# Patient Record
Sex: Male | Born: 1980 | Race: Black or African American | Hispanic: No | Marital: Married | Smoking: Current every day smoker
Health system: Southern US, Community
[De-identification: ages and names within clinical notes are randomized; demographics above are authoritative.]

---

## 2017-08-18 ENCOUNTER — Inpatient Hospital Stay
Admission: EM | Admit: 2017-08-18 | Discharge: 2017-08-20 | DRG: 299 | Disposition: A | Discharge status: Home / Self Care | Payer: BLUE CROSS/BLUE SHIELD | Attending: Internal Medicine | Admitting: Internal Medicine

## 2017-08-18 ENCOUNTER — Emergency Department: Payer: BLUE CROSS/BLUE SHIELD

## 2017-08-18 ENCOUNTER — Other Ambulatory Visit: Payer: Self-pay

## 2017-08-18 ENCOUNTER — Inpatient Hospital Stay: Payer: BLUE CROSS/BLUE SHIELD

## 2017-08-18 ENCOUNTER — Encounter: Payer: Self-pay | Admitting: Emergency Medicine

## 2017-08-18 DIAGNOSIS — I2602 Saddle embolus of pulmonary artery with acute cor pulmonale: Secondary | ICD-10-CM

## 2017-08-18 DIAGNOSIS — I82431 Acute embolism and thrombosis of right popliteal vein: Secondary | ICD-10-CM | POA: Diagnosis present

## 2017-08-18 DIAGNOSIS — F17210 Nicotine dependence, cigarettes, uncomplicated: Secondary | ICD-10-CM | POA: Diagnosis not present

## 2017-08-18 DIAGNOSIS — I82411 Acute embolism and thrombosis of right femoral vein: Secondary | ICD-10-CM | POA: Diagnosis not present

## 2017-08-18 DIAGNOSIS — R0602 Shortness of breath: Secondary | ICD-10-CM | POA: Diagnosis not present

## 2017-08-18 DIAGNOSIS — R748 Abnormal levels of other serum enzymes: Secondary | ICD-10-CM | POA: Diagnosis present

## 2017-08-18 DIAGNOSIS — F17200 Nicotine dependence, unspecified, uncomplicated: Secondary | ICD-10-CM | POA: Diagnosis present

## 2017-08-18 DIAGNOSIS — I2699 Other pulmonary embolism without acute cor pulmonale: Secondary | ICD-10-CM | POA: Diagnosis not present

## 2017-08-18 DIAGNOSIS — R55 Syncope and collapse: Secondary | ICD-10-CM

## 2017-08-18 DIAGNOSIS — R609 Edema, unspecified: Secondary | ICD-10-CM

## 2017-08-18 DIAGNOSIS — E876 Hypokalemia: Secondary | ICD-10-CM | POA: Diagnosis present

## 2017-08-18 LAB — HEPATIC FUNCTION PANEL
ALK PHOS: 54 U/L (ref 38–126)
ALT: 22 U/L (ref 17–63)
AST: 31 U/L (ref 15–41)
Albumin: 4.3 g/dL (ref 3.5–5.0)
BILIRUBIN INDIRECT: 1 mg/dL — AB (ref 0.3–0.9)
Bilirubin, Direct: 0.1 mg/dL (ref 0.1–0.5)
TOTAL PROTEIN: 8.1 g/dL (ref 6.5–8.1)
Total Bilirubin: 1.1 mg/dL (ref 0.3–1.2)

## 2017-08-18 LAB — URINALYSIS, COMPLETE (UACMP) WITH MICROSCOPIC
BACTERIA UA: NONE SEEN
BILIRUBIN URINE: NEGATIVE
GLUCOSE, UA: NEGATIVE mg/dL
HGB URINE DIPSTICK: NEGATIVE
KETONES UR: NEGATIVE mg/dL
LEUKOCYTES UA: NEGATIVE
NITRITE: NEGATIVE
PROTEIN: NEGATIVE mg/dL
Specific Gravity, Urine: 1.017 (ref 1.005–1.030)
pH: 6 (ref 5.0–8.0)

## 2017-08-18 LAB — URINE DRUG SCREEN, QUALITATIVE (ARMC ONLY)
Amphetamines, Ur Screen: NOT DETECTED
BARBITURATES, UR SCREEN: NOT DETECTED
BENZODIAZEPINE, UR SCRN: NOT DETECTED
CANNABINOID 50 NG, UR ~~LOC~~: NOT DETECTED
Cocaine Metabolite,Ur ~~LOC~~: NOT DETECTED
MDMA (ECSTASY) UR SCREEN: NOT DETECTED
Methadone Scn, Ur: NOT DETECTED
Opiate, Ur Screen: NOT DETECTED
PHENCYCLIDINE (PCP) UR S: NOT DETECTED
Tricyclic, Ur Screen: NOT DETECTED

## 2017-08-18 LAB — ETHANOL

## 2017-08-18 LAB — LIPASE, BLOOD: LIPASE: 34 U/L (ref 11–51)

## 2017-08-18 LAB — BASIC METABOLIC PANEL
Anion gap: 10 (ref 5–15)
BUN: 11 mg/dL (ref 6–20)
CO2: 23 mmol/L (ref 22–32)
Calcium: 9.1 mg/dL (ref 8.9–10.3)
Chloride: 104 mmol/L (ref 101–111)
Creatinine, Ser: 1.07 mg/dL (ref 0.61–1.24)
GFR calc Af Amer: >60 mL/min (ref >60)
Glucose, Bld: 173 mg/dL — ABNORMAL HIGH (ref 65–99)
POTASSIUM: 3 mmol/L — AB (ref 3.5–5.1)
SODIUM: 137 mmol/L (ref 135–145)

## 2017-08-18 LAB — CBC
HEMATOCRIT: 42.8 % (ref 40.0–52.0)
HEMOGLOBIN: 13.8 g/dL (ref 13.0–18.0)
MCH: 28.9 pg (ref 26.0–34.0)
MCHC: 32.3 g/dL (ref 32.0–36.0)
MCV: 89.6 fL (ref 80.0–100.0)
Platelets: 161 10*3/uL (ref 150–440)
RBC: 4.78 MIL/uL (ref 4.40–5.90)
RDW: 13.5 % (ref 11.5–14.5)
WBC: 13.1 10*3/uL — AB (ref 3.8–10.6)

## 2017-08-18 LAB — APTT: aPTT: 99 seconds — ABNORMAL HIGH (ref 24–36)

## 2017-08-18 LAB — FIBRIN DERIVATIVES D-DIMER (ARMC ONLY): Fibrin derivatives D-dimer (ARMC): 6491.42 ng/mL (FEU) — ABNORMAL HIGH (ref 0.00–499.00)

## 2017-08-18 LAB — ANTITHROMBIN III: AntiThromb III Func: 111 % (ref 75–120)

## 2017-08-18 LAB — PROTIME-INR
INR: 1.1
PROTHROMBIN TIME: 14.1 s (ref 11.4–15.2)

## 2017-08-18 LAB — HEMOGLOBIN A1C
HEMOGLOBIN A1C: 5.3 % (ref 4.8–5.6)
Mean Plasma Glucose: 105.41 mg/dL

## 2017-08-18 LAB — HEPARIN LEVEL (UNFRACTIONATED)
Heparin Unfractionated: 1.32 IU/mL — ABNORMAL HIGH (ref 0.30–0.70)
Heparin Unfractionated: 0.64 IU/mL (ref 0.30–0.70)

## 2017-08-18 LAB — TROPONIN I
TROPONIN I: 0.03 ng/mL — AB (ref <0.03)
Troponin I: 0.61 ng/mL (ref <0.03)

## 2017-08-18 LAB — MAGNESIUM: MAGNESIUM: 2 mg/dL (ref 1.7–2.4)

## 2017-08-18 MED ORDER — ONDANSETRON HCL 4 MG/2ML IJ SOLN: 4.0000 mg | Freq: Four times a day (QID) | INTRAMUSCULAR | Status: DC | PRN

## 2017-08-18 MED ORDER — ACETAMINOPHEN 650 MG RE SUPP: 650.0000 mg | Freq: Four times a day (QID) | RECTAL | Status: DC | PRN

## 2017-08-18 MED ORDER — HEPARIN (PORCINE) IN NACL 100-0.45 UNIT/ML-% IJ SOLN
1600.0000 [IU]/h | INTRAMUSCULAR | Status: DC
  Administered 2017-08-18: 1600 [IU]/h via INTRAVENOUS
  Filled 2017-08-18: qty 250

## 2017-08-18 MED ORDER — POTASSIUM CHLORIDE CRYS ER 20 MEQ PO TBCR
40.0000 meq | EXTENDED_RELEASE_TABLET | Freq: Once | ORAL | Status: AC
  Administered 2017-08-18: 40 meq via ORAL
  Filled 2017-08-18: qty 2

## 2017-08-18 MED ORDER — HEPARIN (PORCINE) IN NACL 100-0.45 UNIT/ML-% IJ SOLN
1400.0000 [IU]/h | INTRAMUSCULAR | Status: DC
  Administered 2017-08-18: 1600 [IU]/h via INTRAVENOUS
  Administered 2017-08-19: 1400 [IU]/h via INTRAVENOUS
  Filled 2017-08-18 (×2): qty 250

## 2017-08-18 MED ORDER — SODIUM CHLORIDE 0.9 % IV SOLN
INTRAVENOUS | Status: DC
  Administered 2017-08-18 – 2017-08-20 (×4): via INTRAVENOUS

## 2017-08-18 MED ORDER — ONDANSETRON HCL 4 MG PO TABS: 4.0000 mg | ORAL_TABLET | Freq: Four times a day (QID) | ORAL | Status: DC | PRN

## 2017-08-18 MED ORDER — HEPARIN BOLUS VIA INFUSION
5500.0000 [IU] | Freq: Once | INTRAVENOUS | Status: AC
Start: 2017-08-18 — End: 2017-08-18
  Administered 2017-08-18: 5500 [IU] via INTRAVENOUS
  Filled 2017-08-18: qty 5500

## 2017-08-18 MED ORDER — IOPAMIDOL (ISOVUE-370) INJECTION 76%
75.0000 mL | Freq: Once | INTRAVENOUS | Status: AC | PRN
  Administered 2017-08-18: 75 mL via INTRAVENOUS

## 2017-08-18 MED ORDER — SODIUM CHLORIDE 0.9 % IV BOLUS (SEPSIS)
1000.0000 mL | Freq: Once | INTRAVENOUS | Status: AC
  Administered 2017-08-18: 1000 mL via INTRAVENOUS

## 2017-08-18 MED ORDER — NICOTINE 21 MG/24HR TD PT24
21.0000 mg | MEDICATED_PATCH | Freq: Every day | TRANSDERMAL | Status: DC
  Administered 2017-08-18 – 2017-08-20 (×3): 21 mg via TRANSDERMAL
  Filled 2017-08-18 (×3): qty 1

## 2017-08-18 MED ORDER — ACETAMINOPHEN 325 MG PO TABS: 650.0000 mg | ORAL_TABLET | Freq: Four times a day (QID) | ORAL | Status: DC | PRN

## 2017-08-18 NOTE — ED Notes (Signed)
Lab called regarding pending labs. State they were unaware of new orders but would run them immediately. MD and family made aware.

## 2017-08-18 NOTE — ED Notes (Signed)
Pt given urinal, unable to provide urine sample at this time.

## 2017-08-18 NOTE — Discharge Instructions (Addendum)
From Dr. Dolores FrameSung: 1.  Drink plenty of fluids and get plenty of rest daily. 2.  Return to the ER for worsening symptoms, persistent vomiting, difficulty breathing or other concerns.  From Dr. Shaune PollackLord: Although no certain cause was found, your exam and evaluation are overall reassuring in the emergency department today.  Make sure you are getting plenty of rest, at this may have been brought on by extreme exhaustion.

## 2017-08-18 NOTE — Progress Notes (Signed)
Attempt to see patient this afternoon. Patient is off floor. Will stop by tomorrow.

## 2017-08-18 NOTE — Progress Notes (Signed)
ANTICOAGULATION CONSULT NOTE - Initial Consult  Pharmacy Consult for heparin drip Indication: pulmonary embolus  No Known Allergies  Patient Measurements: Height: 5' 9.5" (176.5 cm) Weight: 230 lb 6.4 oz (104.5 kg) IBW/kg (Calculated) : 71.85 Heparin Dosing Weight: 95 kg  Vital Signs: Temp: 98 F (36.7 C) (02/28 1333) Temp Source: Oral (02/28 1333) BP: 129/79 (02/28 1333) Pulse Rate: 68 (02/28 1333)  Labs: Recent Labs    08/18/17 0524 08/18/17 1016 08/18/17 1155 08/18/17 2027  HGB 13.8  --   --   --   HCT 42.8  --   --   --   PLT 161  --   --   --   APTT  --   --  99*  --   LABPROT  --   --  14.1  --   INR  --   --  1.10  --   HEPARINUNFRC  --   --  1.32* 0.64  CREATININE 1.07  --   --   --   TROPONINI 0.03* 0.61*  --   --     Estimated Creatinine Clearance: 111.3 mL/min (by C-G formula based on SCr of 1.07 mg/dL).   Medical History: History reviewed. No pertinent past medical history.  Assessment: Pharmacy consulted to dose and monitor heparin drip in this 37 year old male diagnosed with PE. Patient was not on anticoagulation prior to admission per med rec. Baseline labs ordered.  Goal of Therapy:  Heparin level 0.3-0.7 units/ml Monitor platelets by anticoagulation protocol: Yes   Plan:  Give 5500 units bolus x 1 Start heparin infusion at 1600 units/hr Check anti-Xa level in 6 hours and daily while on heparin Continue to monitor H&H and platelets   2/28@2100  HL 0.64, continue current rate of heparin 1600units/hr and recheck in 6 hours per protocol.   Luan PullingGarrett Kylee Umana, PharmD, BCPS Clinical Pharmacist 08/18/2017,9:08 PM

## 2017-08-18 NOTE — H&P (Signed)
[employer] [facility] [place] [facility]   PATIENT NAME: Jay Barnes    MR#:  [id number]  DATE OF BIRTH:  [birth year]  DATE OF ADMISSION:  08/18/2017  PRIMARY CARE PHYSICIAN: System, Pcp Not In   REQUESTING/REFERRING PHYSICIAN: Governor Rooks MD  CHIEF COMPLAINT:   Chief Complaint  Patient presents with  . Near Syncope  . Emesis    HISTORY OF PRESENT ILLNESS: Jay Barnes  is a 37 y.o. male with no medical problems presented to the emergency with complaint of near syncope and nausea and vomiting.  Patient in the ER was noted to have elevated troponin therefore d-dimer was checked which was elevated therefore had a CT of the chest per pulmonary embolism protocol was done which showed bilateral saddle emboli. The ER physician discussed with intensivist at [facility] who stated that patient did not need TPA or any other intervention because he was not hypoxic and his blood pressure is currently stable.  Patient denies any chest pain or shortness of breath he just felt dizzy.  PAST MEDICAL HISTORY:  History reviewed. No pertinent past medical history.  PAST SURGICAL HISTORY: History reviewed. No pertinent surgical history.  SOCIAL HISTORY:  Social History   Tobacco Use  . Smoking status: Current Every Day Smoker  . Smokeless tobacco: Never Used  Substance Use Topics  . Alcohol use: Yes    Comment: occasional     FAMILY HISTORY:  Family History  Problem Relation Age of Onset  . CAD Maternal Grandmother     DRUG ALLERGIES: No Known Allergies  REVIEW OF SYSTEMS:   CONSTITUTIONAL: No fever, positive fatigue or positive weakness.  EYES: No blurred or double vision.  EARS, NOSE, AND THROAT: No tinnitus or ear pain.  RESPIRATORY: No cough, shortness of breath, wheezing or hemoptysis.  CARDIOVASCULAR: No chest pain, orthopnea, edema.  GASTROINTESTINAL: No nausea, vomiting, diarrhea or abdominal pain.  GENITOURINARY: No dysuria, hematuria.  ENDOCRINE: No  polyuria, nocturia,  HEMATOLOGY: No anemia, easy bruising or bleeding SKIN: No rash or lesion. MUSCULOSKELETAL: No joint pain or arthritis.   NEUROLOGIC: No tingling, numbness, weakness.  PSYCHIATRY: No anxiety or depression.   MEDICATIONS AT HOME:  Prior to Admission medications   Not on File      PHYSICAL EXAMINATION:   VITAL SIGNS: Blood pressure 133/81, pulse 78, temperature 97.6 F (36.4 C), temperature source Oral, resp. rate 20, height 5' 9.5" (1.765 m), weight 237 lb (107.5 kg), SpO2 97 %.  GENERAL:  37 y.o.-year-old patient lying in the bed with no acute distress.  EYES: Pupils equal, round, reactive to light and accommodation. No scleral icterus. Extraocular muscles intact.  HEENT: Head atraumatic, normocephalic. Oropharynx and nasopharynx clear.  NECK:  Supple, no jugular venous distention. No thyroid enlargement, no tenderness.  LUNGS: Normal breath sounds bilaterally, no wheezing, rales,rhonchi or crepitation. No use of accessory muscles of respiration.  CARDIOVASCULAR: S1, S2 normal. No murmurs, rubs, or gallops.  ABDOMEN: Soft, nontender, nondistended. Bowel sounds present. No organomegaly or mass.  EXTREMITIES: No pedal edema, cyanosis, or clubbing.  NEUROLOGIC: Cranial nerves II through XII are intact. Muscle strength 5/5 in all extremities. Sensation intact. Gait not checked.  PSYCHIATRIC: The patient is alert and oriented x 3.  SKIN: No obvious rash, lesion, or ulcer.   LABORATORY PANEL:   CBC Recent Labs  Lab 08/18/17 0524  WBC 13.1*  HGB 13.8  HCT 42.8  PLT 161  MCV 89.6  MCH 28.9  MCHC 32.3  RDW 13.5   ------------------------------------------------------------------------------------------------------------------  Chemistries  Recent Labs  Lab 08/18/17 0524  NA 137  K 3.0*  CL 104  CO2 23  GLUCOSE 173*  BUN 11  CREATININE 1.07  CALCIUM 9.1  AST 31  ALT 22  ALKPHOS 54  BILITOT 1.1    ------------------------------------------------------------------------------------------------------------------ estimated creatinine clearance is 112.9 mL/min (by C-G formula based on SCr of 1.07 mg/dL). ------------------------------------------------------------------------------------------------------------------ No results for input(s): TSH, T4TOTAL, T3FREE, THYROIDAB in the last 72 hours.  Invalid input(s): FREET3   Coagulation profile No results for input(s): INR, PROTIME in the last 168 hours. ------------------------------------------------------------------------------------------------------------------- No results for input(s): DDIMER in the last 72 hours. -------------------------------------------------------------------------------------------------------------------  Cardiac Enzymes Recent Labs  Lab 08/18/17 0524 08/18/17 1016  TROPONINI 0.03* 0.61*   ------------------------------------------------------------------------------------------------------------------ Invalid input(s): POCBNP  ---------------------------------------------------------------------------------------------------------------  Urinalysis    Component Value Date/Time   COLORURINE YELLOW (A) 08/18/2017 0633   APPEARANCEUR HAZY (A) 08/18/2017 0633   LABSPEC 1.017 08/18/2017 0633   PHURINE 6.0 08/18/2017 0633   GLUCOSEU NEGATIVE 08/18/2017 0633   HGBUR NEGATIVE 08/18/2017 0633   BILIRUBINUR NEGATIVE 08/18/2017 0633   KETONESUR NEGATIVE 08/18/2017 0633   PROTEINUR NEGATIVE 08/18/2017 0633   NITRITE NEGATIVE 08/18/2017 0633   LEUKOCYTESUR NEGATIVE 08/18/2017 [id number]     RADIOLOGY: Ct Head Wo Contrast  Result Date: 08/18/2017 CLINICAL DATA:  37 year old male with lightheadedness and weakness. EXAM: CT HEAD WITHOUT CONTRAST TECHNIQUE: Contiguous axial images were obtained from the base of the skull through the vertex without intravenous contrast. COMPARISON:  None. FINDINGS: Brain: No  evidence of acute infarction, hemorrhage, hydrocephalus, extra-axial collection or mass lesion/mass effect. Vascular: No hyperdense vessel or unexpected calcification. Skull: Normal. Negative for fracture or focal lesion. Sinuses/Orbits: No acute finding. Other: None. IMPRESSION: Normal noncontrast CT of the brain. Electronically Signed   By: Elgie CollardArash  Radparvar M.D.   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 Shortness of breath EXAM: CT ANGIOGRAPHY CHEST WITH CONTRAST TECHNIQUE: Multidetector CT imaging of the chest was performed using the standard protocol during bolus administration of intravenous contrast. Multiplanar CT image reconstructions and MIPs were obtained to evaluate the vascular anatomy. CONTRAST:  75mL ISOVUE-370 IOPAMIDOL (ISOVUE-370) INJECTION 76% COMPARISON:  None FINDINGS: Cardiovascular: The heart size is normal. There is no pericardial effusion. Mild aortic atherosclerosis. Large saddle embolus and bilateral central obstructing pulmonary emboli identified. Bilateral upper and lower lobar and segmental pulmonary artery filling defects are noted. The RV to LV ratio is equal to 5/3.2 = 1.6. Mediastinum/Nodes: The trachea appears patent and midline. Normal appearance of the thyroid gland. Normal appearance of the esophagus. No enlarged mediastinal or hilar lymph nodes. Lungs/Pleura: No airspace consolidation or atelectasis. No pneumothorax. Upper Abdomen: No acute abnormality. Musculoskeletal: No aggressive lytic or sclerotic bone lesions. Review of the MIP images confirms the above findings. IMPRESSION: 1. Positive for acute PE with CT evidence of right heart strain (RV/LV Ratio = 1.6.) consistent with at least submassive (intermediate risk) PE. The presence of right heart strain has been associated with an increased risk of morbidity and mortality. Please activate  Code PE by paging [phone]. 2. Critical Value/emergent results were called by telephone at the time of interpretation on 08/18/2017 at 11:01 am to Dr. Governor RooksEBECCA LORD , who verbally acknowledged these results. 3.  Aortic Atherosclerosis (ICD10-I70.0). Electronically Signed   By: Signa Kellaylor  Stroud M.D.   On: 08/18/2017 11:02   [facility]  Result Date: 08/18/2017 CLINICAL DATA:  37 year old male with near syncope. EXAM: PORTABLE CHEST 1 VIEW COMPARISON:  None. FINDINGS: The heart size and mediastinal contours are within normal limits. Both lungs are clear. The visualized skeletal structures are unremarkable. IMPRESSION: No active disease. Electronically Signed   By: Elgie Collard M.D.   On: 08/18/2017 06:04    EKG: Orders placed or performed during the hospital encounter of 08/18/17  . EKG 12-Lead  . EKG 12-Lead  . ED EKG  . ED EKG  . ED EKG  . ED EKG  . EKG 12-Lead  . EKG 12-Lead    IMPRESSION AND PLAN: Patient is a 37 year old African-American male presenting with dizziness  1.  Bilateral saddle pulmonary embolism Per intensivist at [facility] does not need TPA We will treat patient with heparin Check bilateral lower extremity Doppler Start hypercoagulable workup Hematology evaluation Keep tele on him  2.  Elevated troponin due to PE  3.  Hypokalemia potassium is being replete  4.  Nicotine abuse smoking cessation provided 4 minutes spent wrongly recommend he stop smoking nicotine patch will be started    All the records are reviewed and case discussed with ED provider. Management plans discussed with the patient, family and they are in agreement.  CODE STATUS: Code Status History    This patient does not have a recorded code status. Please follow your organizational policy for patients in this situation.       TOTAL TIME TAKING CARE OF THIS PATIENT:55 minutes.    Auburn Bilberry M.D on 08/18/2017 at 11:40 AM  Between 7am to 6pm - Pager -  [phone]  After 6pm go to [email or web address] - password [email or web address]  [facility]  Office  [phone]  CC: Primary care physician; System, Pcp Not In

## 2017-08-18 NOTE — Progress Notes (Signed)
ANTICOAGULATION CONSULT NOTE - Initial Consult  Pharmacy Consult for heparin drip Indication: pulmonary embolus  No Known Allergies  Patient Measurements: Height: 5' 9.5" (176.5 cm) Weight: 237 lb (107.5 kg) IBW/kg (Calculated) : 71.85 Heparin Dosing Weight: 95 kg  Vital Signs: Temp: 97.6 F (36.4 C) (02/28 0522) Temp Source: Oral (02/28 0522) BP: 133/81 (02/28 1000) Pulse Rate: 78 (02/28 1000)  Labs: Recent Labs    08/18/17 0524 08/18/17 1016  HGB 13.8  --   HCT 42.8  --   PLT 161  --   CREATININE 1.07  --   TROPONINI 0.03* 0.61*    Estimated Creatinine Clearance: 112.9 mL/min (by C-G formula based on SCr of 1.07 mg/dL).   Medical History: History reviewed. No pertinent past medical history.  Assessment: Pharmacy consulted to dose and monitor heparin drip in this 37 year old male diagnosed with PE. Patient was not on anticoagulation prior to admission per med rec. Baseline labs ordered.  Goal of Therapy:  Heparin level 0.3-0.7 units/ml Monitor platelets by anticoagulation protocol: Yes   Plan:  Give 5500 units bolus x 1 Start heparin infusion at 1600 units/hr Check anti-Xa level in 6 hours and daily while on heparin Continue to monitor H&H and platelets  Cindi CarbonMary M Markeya Mincy, PharmD, BCPS Clinical Pharmacist 08/18/2017,11:15 AM

## 2017-08-18 NOTE — ED Notes (Signed)
Patient and family updated on lab results and repeat testing by this RN and MD. Family made aware of pending procedure. Verbalized understanding. Family and patient given drinks and warm blanket.

## 2017-08-18 NOTE — ED Triage Notes (Addendum)
Pt bib [facility] from work where he felt lightheaded and weak, did not pass out, but urinated on himself and does not recall it. Pt vomited with EMS one time. Pt states he is feeling weak at this time. A&Ox4. Denies medications, drugs, diet changes.

## 2017-08-18 NOTE — Progress Notes (Signed)
Admitted from the ED with bilateral PE on heparin drip.  No other symptoms or complaints.

## 2017-08-18 NOTE — ED Notes (Signed)
Pt provided water and graham crackers with PB

## 2017-08-18 NOTE — ED Provider Notes (Signed)
[facility] Emergency Department Provider Note   ____________________________________________   First MD Initiated Contact with Patient 08/18/17 [phone]     (approximate)  I have reviewed the triage vital signs and the nursing notes.   HISTORY  Chief Complaint Near Syncope and Emesis    HPI Jay Barnes is a 37 y.o. male brought to the ED from work via EMS with a chief complaint of generalized weakness, lightheadedness and near syncope.  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  He did not pass out but eased himself to the ground.  He had to urinate but did not have the strength to get up so he intentionally urinated on the floor EMS reports vomiting once.  Patient reports usually he is able to get a nap in the afternoons but yesterday he was not tired so did not nap.  Has not been to sleep in the past 25 hours.  Denies recent fever, chills, chest pain, shortness of breath, abdominal pain, nausea, diarrhea.  Denies recent trauma.  Denies illicit drug use.     Past medical history None  There are no active problems to display for this patient.   History reviewed. No pertinent surgical history.  Prior to Admission medications   Not on File    Allergies Patient has no known allergies.  No family history on file.  Social History Social History   Tobacco Use  . Smoking status: Current Every Day Smoker  . Smokeless tobacco: Never Used  Substance Use Topics  . Alcohol use: Yes    Comment: occasional   . Drug use: No    Review of Systems   Constitutional: Positive for generalized weakness.  No fever/chills. Eyes: No visual changes. ENT: No sore throat. Cardiovascular: Denies chest pain. Respiratory: Denies shortness of breath. Gastrointestinal: No abdominal pain.  No  nausea, no vomiting.  No diarrhea.  No constipation. Genitourinary: Negative for dysuria. Musculoskeletal: Negative for back pain. Skin: Negative for rash. Neurological: Positive for near syncope.  Negative for headaches, focal weakness or numbness.   ____________________________________________   PHYSICAL EXAM:  VITAL SIGNS: ED Triage Vitals  Enc Vitals Group     BP 08/18/17 0522 131/79     Pulse Rate 08/18/17 0522 87     Resp 08/18/17 0522 19     Temp 08/18/17 0522 97.6 F (36.4 C)     Temp Source 08/18/17 0522 Oral     SpO2 08/18/17 0522 97 %     Weight 08/18/17 0523 237 lb (107.5 kg)     Height 08/18/17 0523 5' 9.5" (1.765 m)     Head Circumference --      Peak Flow --      Pain Score --      Pain Loc --      Pain Edu? --      Excl. in GC? --     Constitutional: Asleep, awakened for exam.  Alert and oriented. Well appearing and in no acute distress. Eyes: Conjunctivae are normal. PERRL. EOMI. Head: Atraumatic. Nose: No congestion/rhinnorhea. Mouth/Throat: Mucous membranes are moist.  Oropharynx non-erythematous. Neck: No stridor.  No carotid bruits. Cardiovascular: Normal rate, regular rhythm. Grossly normal heart sounds.  Good peripheral circulation. Respiratory: Normal respiratory effort.  No retractions. Lungs CTAB. Gastrointestinal: Soft and nontender. No distention. No abdominal  bruits. No CVA tenderness. Musculoskeletal: No lower extremity tenderness nor edema.  No joint effusions. Neurologic:  Normal speech and language. No gross focal neurologic deficits are appreciated. No gait instability. Skin:  Skin is warm, dry and intact. No rash noted. Psychiatric: Mood and affect are normal. Speech and behavior are normal.  ____________________________________________   LABS (all labs ordered are listed, but only abnormal results are displayed)  Labs Reviewed  BASIC METABOLIC PANEL - Abnormal; Notable for the following components:      Result Value   Potassium  3.0 (*)    Glucose, Bld 173 (*)    All other components within normal limits  CBC - Abnormal; Notable for the following components:   WBC 13.1 (*)    All other components within normal limits  URINALYSIS, COMPLETE (UACMP) WITH MICROSCOPIC - Abnormal; Notable for the following components:   Color, Urine YELLOW (*)    APPearance HAZY (*)    Squamous Epithelial / LPF 0-5 (*)    All other components within normal limits  TROPONIN I  URINE DRUG SCREEN, QUALITATIVE ([facility] ONLY)  HEPATIC FUNCTION PANEL  LIPASE, BLOOD  ETHANOL  FIBRIN DERIVATIVES D-DIMER ([facility] ONLY)  CBG MONITORING, ED   ____________________________________________  EKG  ED ECG REPORT I, Rome Echavarria J, the attending physician, personally viewed and interpreted this ECG.   Date: 08/18/2017  EKG Time: 0521  Rate: 82  Rhythm: normal EKG, normal sinus rhythm  Axis: Normal  Intervals:none  ST&T Change: Nonspecific  ____________________________________________  RADIOLOGY  ED MD interpretation: No acute process  Official radiology report(s): Ct Head Wo Contrast  Result Date: 08/18/2017 CLINICAL DATA:  37 year old male with lightheadedness and weakness. EXAM: CT HEAD WITHOUT CONTRAST TECHNIQUE: Contiguous axial images were obtained from the base of the skull through the vertex without intravenous contrast. COMPARISON:  None. FINDINGS: Brain: No evidence of acute infarction, hemorrhage, hydrocephalus, extra-axial collection or mass lesion/mass effect. Vascular: No hyperdense vessel or unexpected calcification. Skull: Normal. Negative for fracture or focal lesion. Sinuses/Orbits: No acute finding. Other: None. IMPRESSION: Normal noncontrast CT of the brain. Electronically Signed   By: Elgie Collard M.D.   On: 08/18/2017 06:01   Dg Chest Port 1 View  Result Date: 08/18/2017 CLINICAL DATA:  37 year old male with near syncope. EXAM: PORTABLE CHEST 1 VIEW COMPARISON:  None. FINDINGS: The heart size and mediastinal contours  are within normal limits. Both lungs are clear. The visualized skeletal structures are unremarkable. IMPRESSION: No active disease. Electronically Signed   By: Elgie Collard M.D.   On: 08/18/2017 06:04    ____________________________________________   PROCEDURES  Procedure(s) performed: None  Procedures  Critical Care performed: No  ____________________________________________   INITIAL IMPRESSION / ASSESSMENT AND PLAN / ED COURSE  As part of my medical decision making, I reviewed the following data within the electronic MEDICAL RECORD NUMBER Nursing notes reviewed and incorporated, Labs reviewed, EKG interpreted, Old chart reviewed, Radiograph reviewed and Notes from prior ED visits.   37 year old male who presents with generalized weakness, near syncope.  This is in the setting of sleep deprivation.  Differential diagnosis includes but is not limited to fatigue, CAD, TIA, CVA, hypoglycemia, infectious etiologies, etc.  Laboratory results thus far notable for mild leukocytosis, mild hypokalemia.  CT head and chest x-ray unremarkable.  Will obtain orthostatic vital signs, initiate IV fluid resuscitation, replete potassium.  Check UDS, troponin, d-dimer.  Patient hungry; will provide sandwich tray.  Clinical Course as of Aug 18 710  Thu Aug 18, 2017  [id number] Pending labwork and UDS. Care transferred to Dr. Shaune Pollack. Anticipate discharge home if labwork unremarkable.   [JS]    Clinical Course User Index [JS] Irean Hong, MD     ____________________________________________   FINAL CLINICAL IMPRESSION(S) / ED DIAGNOSES  Final diagnoses:  Near syncope  Hypokalemia     ED Discharge Orders    None       Note:  This document was prepared using Dragon voice recognition software and may include unintentional dictation errors.    Irean Hong, MD 08/18/17 [phone]

## 2017-08-18 NOTE — ED Provider Notes (Signed)
[facility]  I accepted care from Dr. Dolores FrameSung ____________________________________________    LABS (pertinent positives/negatives)  I, Jay Rooksebecca Karanvir Balderston, MD have personally reviewed the lab reports noted below.  Labs Reviewed  BASIC METABOLIC PANEL - Abnormal; Notable for the following components:      Result Value   Potassium 3.0 (*)    Glucose, Bld 173 (*)    All other components within normal limits  CBC - Abnormal; Notable for the following components:   WBC 13.1 (*)    All other components within normal limits  URINALYSIS, COMPLETE (UACMP) WITH MICROSCOPIC - Abnormal; Notable for the following components:   Color, Urine YELLOW (*)    APPearance HAZY (*)    Squamous Epithelial / LPF 0-5 (*)    All other components within normal limits  HEPATIC FUNCTION PANEL - Abnormal; Notable for the following components:   Indirect Bilirubin 1.0 (*)    All other components within normal limits  FIBRIN DERIVATIVES D-DIMER ([facility] ONLY) - Abnormal; Notable for the following components:   Fibrin derivatives D-dimer [facility]) [phone] (*)    All other components within normal limits  TROPONIN I - Abnormal; Notable for the following components:   Troponin I 0.03 (*)    All other components within normal limits  TROPONIN I - Abnormal; Notable for the following components:   Troponin I 0.61 (*)    All other components within normal limits  URINE DRUG SCREEN, QUALITATIVE ([facility] ONLY)  LIPASE, BLOOD  ETHANOL  APTT  PROTIME-INR  CBG MONITORING, ED     ____________________________________________    RADIOLOGY All xrays were viewed by me. Imaging interpreted by radiologist.  I, Jay Rooksebecca Dyllan Kats MD have personally reviewed the imaging report noted below.  CT chest for PE radiologist (I spoke by phone with radiologist): Bilateral/saddle PE with right heart strain.  ____________________________________________   PROCEDURES  Procedure(s) performed: None  Critical Care  performed: None  ____________________________________________   INITIAL IMPRESSION / ASSESSMENT AND PLAN / ED COURSE   Pertinent labs & imaging results that were available during my care of the patient were reviewed by me and considered in my medical decision making (see chart for details).  I accepted care from Dr. Dolores FrameSung at shift change.  Suspicion with initial laboratory studies back CBC and CMP reassuring, orthostatics reassuring, as the patient had exhaustion after being awake for 25+ hours.  Awaiting several additional studies.  Review of troponin 0 0.03, doubt ACS, will send repeat troponin now, as it has been about 5 hours already.  No ongoing chest pain.  D-dimer significantly elevated, discussed with patient, chose to proceed with CT to rule out PE.  Discussed with radiologist, significant bilateral/saddle PE with right heart strain.  Discussed and updated the patient, started on heparin.  Radiologist did activate code PE.  I discussed with intensivist at [facility].   CONSULTATIONS: Discussed with radiologist by phone the PET scan result.  I discussed with intensivist at [facility], no indication for thrombectomy or transfer given no hypoxia or hypotension.  Patient will be placed on heparin and admitted to hospitalist here.  [facility]iscuss[facility] for [facility].    Patient / Family / Caregiver informed of clinical course, medical decision-making process, and agree with plan.   ____________________________________________   FINAL CLINICAL IMPRESSION(S) / ED DIAGNOSES  Final diagnoses:  Near syncope  Hypokalemia  Acute saddle pulmonary embolism with acute cor pulmonale (HCC)        Jay Barnes, Jay Sherrow, MD 08/18/17 1126

## 2017-08-19 ENCOUNTER — Inpatient Hospital Stay (HOSPITAL_COMMUNITY)
Admit: 2017-08-19 | Discharge: 2017-08-19 | Disposition: A | Discharge status: Home / Self Care | Payer: BLUE CROSS/BLUE SHIELD | Attending: Internal Medicine | Admitting: Internal Medicine

## 2017-08-19 DIAGNOSIS — I2699 Other pulmonary embolism without acute cor pulmonale: Secondary | ICD-10-CM

## 2017-08-19 DIAGNOSIS — I82411 Acute embolism and thrombosis of right femoral vein: Principal | ICD-10-CM

## 2017-08-19 DIAGNOSIS — I2602 Saddle embolus of pulmonary artery with acute cor pulmonale: Secondary | ICD-10-CM

## 2017-08-19 DIAGNOSIS — F17210 Nicotine dependence, cigarettes, uncomplicated: Secondary | ICD-10-CM

## 2017-08-19 DIAGNOSIS — R55 Syncope and collapse: Secondary | ICD-10-CM

## 2017-08-19 DIAGNOSIS — R0602 Shortness of breath: Secondary | ICD-10-CM

## 2017-08-19 LAB — CBC
HEMATOCRIT: 39.9 % — AB (ref 40.0–52.0)
HEMATOCRIT: 39.9 % — AB (ref 40.0–52.0)
HEMOGLOBIN: 13.2 g/dL (ref 13.0–18.0)
Hemoglobin: 13.3 g/dL (ref 13.0–18.0)
MCH: 29.3 pg (ref 26.0–34.0)
MCH: 29.5 pg (ref 26.0–34.0)
MCHC: 33.1 g/dL (ref 32.0–36.0)
MCHC: 33.3 g/dL (ref 32.0–36.0)
MCV: 88.8 fL (ref 80.0–100.0)
MCV: 88.6 fL (ref 80.0–100.0)
Platelets: 151 10*3/uL (ref 150–440)
Platelets: 161 10*3/uL (ref 150–440)
RBC: 4.5 MIL/uL (ref 4.40–5.90)
RBC: 4.5 MIL/uL (ref 4.40–5.90)
RDW: 13.6 % (ref 11.5–14.5)
RDW: 13.7 % (ref 11.5–14.5)
WBC: 8.2 10*3/uL (ref 3.8–10.6)
WBC: 7.4 10*3/uL (ref 3.8–10.6)

## 2017-08-19 LAB — HEPARIN LEVEL (UNFRACTIONATED)
HEPARIN UNFRACTIONATED: 0.41 [IU]/mL (ref 0.30–0.70)
HEPARIN UNFRACTIONATED: 0.47 [IU]/mL (ref 0.30–0.70)
Heparin Unfractionated: 0.88 IU/mL — ABNORMAL HIGH (ref 0.30–0.70)
Heparin Unfractionated: 0.69 IU/mL (ref 0.30–0.70)

## 2017-08-19 LAB — TROPONIN I
TROPONIN I: 0.31 ng/mL — AB (ref <0.03)
TROPONIN I: 0.22 ng/mL — AB (ref <0.03)
Troponin I: 0.21 ng/mL (ref <0.03)

## 2017-08-19 LAB — BASIC METABOLIC PANEL
Anion gap: 6 (ref 5–15)
BUN: 9 mg/dL (ref 6–20)
CALCIUM: 8.7 mg/dL — AB (ref 8.9–10.3)
CO2: 23 mmol/L (ref 22–32)
CREATININE: 1.1 mg/dL (ref 0.61–1.24)
Chloride: 110 mmol/L (ref 101–111)
GFR calc non Af Amer: >60 mL/min (ref >60)
Glucose, Bld: 125 mg/dL — ABNORMAL HIGH (ref 65–99)
Potassium: 4.6 mmol/L (ref 3.5–5.1)
Sodium: 139 mmol/L (ref 135–145)

## 2017-08-19 LAB — ECHOCARDIOGRAM COMPLETE
Height: 69.5 in
Weight: 3686.4 oz

## 2017-08-19 LAB — HOMOCYSTEINE: HOMOCYSTEINE-NORM: 9.8 umol/L (ref 0.0–15.0)

## 2017-08-19 NOTE — Progress Notes (Signed)
[employer] [facility] [place] [facility]   PATIENT NAME: Jay Barnes    MR#:  [id number]  DATE OF BIRTH:  04/05/1981  SUBJECTIVE:  Patient here with large saddle pulmonary emboli which is unprovoked. He denies shortness of breath or chest pain currently  REVIEW OF SYSTEMS:    Review of Systems  Constitutional: Negative for fever, chills weight loss HENT: Negative for ear pain, nosebleeds, congestion, facial swelling, rhinorrhea, neck pain, neck stiffness and ear discharge.   Respiratory: Negative for cough, shortness of breath, wheezing  Cardiovascular: Negative for chest pain, palpitations and leg swelling.  Gastrointestinal: Negative for heartburn, abdominal pain, vomiting, diarrhea or consitpation Genitourinary: Negative for dysuria, urgency, frequency, hematuria Musculoskeletal: Negative for back pain or joint pain Neurological: Negative for dizziness, seizures, syncope, focal weakness,  numbness and headaches.  Hematological: Does not bruise/bleed easily.  Psychiatric/Behavioral: Negative for hallucinations, confusion, dysphoric mood    Tolerating Diet:yes      DRUG ALLERGIES:  No Known Allergies  VITALS:  Blood pressure 113/69, pulse 70, temperature 97.7 F (36.5 C), temperature source Oral, resp. rate 16, height 5' 9.5" (1.765 m), weight 104.5 kg (230 lb 6.4 oz), SpO2 95 %.  PHYSICAL EXAMINATION:  Constitutional: Appears well-developed and well-nourished. No distress. HENT: Normocephalic. Marland Kitchen. Oropharynx is clear and moist.  Eyes: Conjunctivae and EOM are normal. PERRLA, no scleral icterus.  Neck: Normal ROM. Neck supple. No JVD. No tracheal deviation. CVS: RRR, S1/S2 +, no murmurs, no gallops, no carotid bruit.  Pulmonary: Effort and breath sounds normal, no stridor, rhonchi, wheezes, rales.  Abdominal: Soft. BS +,  no distension, tenderness, rebound or guarding.  Musculoskeletal: Normal range of motion. No edema and no tenderness.  Neuro: Alert.  CN 2-12 grossly intact. No focal deficits. Skin: Skin is warm and dry. No rash noted. Psychiatric: Normal mood and affect.      LABORATORY PANEL:   CBC Recent Labs  Lab 08/19/17 0327  WBC 8.2  HGB 13.3  HCT 39.9*  PLT 161   ------------------------------------------------------------------------------------------------------------------  Chemistries  Recent Labs  Lab 08/18/17 0524 08/19/17 0327  NA 137 139  K 3.0* 4.6  CL 104 110  CO2 23 23  GLUCOSE 173* 125*  BUN 11 9  CREATININE 1.07 1.10  CALCIUM 9.1 8.7*  MG 2.0  --   AST 31  --   ALT 22  --   ALKPHOS 54  --   BILITOT 1.1  --    ------------------------------------------------------------------------------------------------------------------  Cardiac Enzymes Recent Labs  Lab 08/18/17 0524 08/18/17 1016 08/19/17 1001  TROPONINI 0.03* 0.61* 0.31*   ------------------------------------------------------------------------------------------------------------------  RADIOLOGY:  Ct Head Wo Contrast  Result Date: 08/18/2017 CLINICAL DATA:  37 year old male with lightheadedness and weakness. EXAM: CT HEAD WITHOUT CONTRAST TECHNIQUE: Contiguous axial images were obtained from the base of the skull through the vertex without intravenous contrast. COMPARISON:  None. FINDINGS: Brain: No evidence of acute infarction, hemorrhage, hydrocephalus, extra-axial collection or mass lesion/mass effect. Vascular: No hyperdense vessel or unexpected calcification. Skull: Normal. Negative for fracture or focal lesion. Sinuses/Orbits: No acute finding. Other: None. IMPRESSION: Normal noncontrast CT of the brain. Electronically Signed   By: Elgie CollardArash  Radparvar M.D.   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 Shortness of breath EXAM: CT ANGIOGRAPHY CHEST WITH CONTRAST TECHNIQUE: Multidetector CT imaging of  the chest was performed using the standard protocol during bolus  administration of intravenous contrast. Multiplanar CT image reconstructions and MIPs were obtained to evaluate the vascular anatomy. CONTRAST:  75mL ISOVUE-370 IOPAMIDOL (ISOVUE-370) INJECTION 76% COMPARISON:  None FINDINGS: Cardiovascular: The heart size is normal. There is no pericardial effusion. Mild aortic atherosclerosis. Large saddle embolus and bilateral central obstructing pulmonary emboli identified. Bilateral upper and lower lobar and segmental pulmonary artery filling defects are noted. The RV to LV ratio is equal to 5/3.2 = 1.6. Mediastinum/Nodes: The trachea appears patent and midline. Normal appearance of the thyroid gland. Normal appearance of the esophagus. No enlarged mediastinal or hilar lymph nodes. Lungs/Pleura: No airspace consolidation or atelectasis. No pneumothorax. Upper Abdomen: No acute abnormality. Musculoskeletal: No aggressive lytic or sclerotic bone lesions. Review of the MIP images confirms the above findings. IMPRESSION: 1. Positive for acute PE with CT evidence of right heart strain (RV/LV Ratio = 1.6.) consistent with at least submassive (intermediate risk) PE. The presence of right heart strain has been associated with an increased risk of morbidity and mortality. Please activate Code PE by paging [phone]. 2. Critical Value/emergent results were called by telephone at the time of interpretation on 08/18/2017 at 11:01 am to Dr. Governor Rooks , who verbally acknowledged these results. 3.  Aortic Atherosclerosis (ICD10-I70.0). Electronically Signed   By: Signa Kell M.D.   On: 08/18/2017 11:02   US Venous Img Lower Bilateral  Result Date: 08/18/2017 CLINICAL DATA:  Lower extremity swelling, bilateral pulmonary emboli EXAM: BILATERAL LOWER EXTREMITY VENOUS DOPPLER ULTRASOUND TECHNIQUE: Gray-scale sonography with graded compression, as well as color Doppler and duplex ultrasound were performed to evaluate the  lower extremity deep venous systems from the level of the common femoral vein and including the common femoral, femoral, profunda femoral, popliteal and calf veins including the posterior tibial, peroneal and gastrocnemius veins when visible. The superficial great saphenous vein was also interrogated. Spectral Doppler was utilized to evaluate flow at rest and with distal augmentation maneuvers in the common femoral, femoral and popliteal veins. COMPARISON:  None. FINDINGS: RIGHT LOWER EXTREMITY Common Femoral Vein: No evidence of thrombus. Normal compressibility, respiratory phasicity and response to augmentation. Saphenofemoral Junction: No evidence of thrombus. Normal compressibility and flow on color Doppler imaging. Profunda Femoral Vein: No evidence of thrombus. Normal compressibility and flow on color Doppler imaging. Femoral Vein: Hypoechoic nonocclusive thrombus in the femoral vein from the mid aspect extending distally to the popliteal vein. Vessel is partially compressible. Popliteal Vein: Hypoechoic intraluminal thrombus present. Vessel is minimally compressible. Thrombus is nearly occlusive. Calf Veins: Acute appearing hypoechoic thrombus extends into the right calf tibial and peroneal veins. Superficial Great Saphenous Vein: No evidence of thrombus. Normal compressibility. Venous Reflux:  None. Other Findings:  None. LEFT LOWER EXTREMITY Common Femoral Vein: No evidence of thrombus. Normal compressibility, respiratory phasicity and response to augmentation. Saphenofemoral Junction: No evidence of thrombus. Normal compressibility and flow on color Doppler imaging. Profunda Femoral Vein: No evidence of thrombus. Normal compressibility and flow on color Doppler imaging. Femoral Vein: No evidence of thrombus. Normal compressibility, respiratory phasicity and response to augmentation. Popliteal Vein: No evidence of thrombus. Normal compressibility, respiratory phasicity and response to augmentation. Calf  Veins: Limited assessment of the calf veins. Posterior tibial vein appears patent. Peroneal veins are difficult to visualize. Superficial Great Saphenous Vein: No evidence of thrombus. Normal compressibility. Venous Reflux:  None. Other Findings:  None. IMPRESSION: Positive exam for right femoropopliteal acute appearing nearly occlusive DVT extending into the right calf veins. Electronically Signed   By: Judie Petit.  Shick M.D.  On: 08/18/2017 16:40   Dg Chest Port 1 View  Result Date: 08/18/2017 CLINICAL DATA:  37 year old male with near syncope. EXAM: PORTABLE CHEST 1 VIEW COMPARISON:  None. FINDINGS: The heart size and mediastinal contours are within normal limits. Both lungs are clear. The visualized skeletal structures are unremarkable. IMPRESSION: No active disease. Electronically Signed   By: Elgie Collard M.D.   On: 08/18/2017 06:04     ASSESSMENT AND PLAN:   37 year old male with history of tobacco dependence who presents with near syncope.  1. Large saddle emboli with acute right femoropopliteal DVT: Follow-up on echocardiogram to evaluate for right heart strain. Continue heparin drip for her today with plans to initiate oral anticoagulation after seen by consultants. Follow-up on vascular, pulmonary and oncology evaluation. Hypercoagulable workup ordered by admitting M.D. since this is unprovoked PE  2.Tobacco dependence: Patient is encouraged to quit smoking. Counseling was provided for 4 minutes.   3. Elevated troponin: This is in the setting of pulmonary emboli. Patient is ruled out for ACS  4. Hypokalemia: Repleted   Ok to ambulate  Management plans discussed with the patient and he is in agreement.  CODE STATUS: full  TOTAL TIME TAKING CARE OF THIS PATIENT: 30 minutes.     POSSIBLE D/C tomorrow, DEPENDING ON CLINICAL CONDITION.   Dafina Suk M.D on 08/19/2017 at 12:42 PM  Between 7am to 6pm - Pager - [phone] After 6pm go to [email or web address] - password [email or web address]  [facility]  [facility]  Office  [phone]  CC: Primary care physician; System, Pcp Not In  Note: This dictation was prepared with Dragon dictation along with smaller phrase technology. Any transcriptional errors that result from this process are unintentional.

## 2017-08-19 NOTE — Consult Note (Signed)
[facility] [place] Pulmonary Medicine Consultation      Assessment and Plan:  Acute saddle pulmonary embolism with evidence of RV strain, syncope. Right lower extremity DVT, femoral, popliteal extending into the calf veins. -Patient presented with a large saddle embolus, however clinically the patient is doing very well without dyspnea or oxygen saturation.  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 -Given his good clinical status had not think there is any need for thrombectomy or other aggressive interventions.  Continue anticoagulation, and transition to oral as usual. -Hematology has been consulted for further workup of underlying cause of pulmonary embolism.  There is a lower extremity DVT does not resolve he may benefit from a lower extremity thrombectomy. --Discussed the importance of avoidance of high risk activities while on blood thinners such as avoiding contact sports, climbing ladders, etc.   Snoring, symptoms and signs of obstructive sleep apnea. -Would recommend outpatient sleep study, patient is asked to follow-up outpatient.  Pulmonary service will sign off for now, please call if there any further questions or concerns.    Date: 08/19/2017  MRN# [id number] Jay Barnes 10/16/1980  Referring Physician: Dr. Juliene PinaMody.   Jay Barnes is a 37 y.o. old male seen in consultation for chief complaint of:    Chief Complaint  Patient presents with  . Near Syncope  . 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  He went around back to open a door, he recalls at that time that he urgently had to urinate, therefore he was rushing to go to the bathroom.  At the time he noted that he was having unusual sensation of being lightheaded, he then laid down.  He woke up within 1-2 minutes, he had urinated on himself.  He immediately got up and went to the bathroom, and open  the door for his friend noted he had taken this a few minutes to open the door.  They then decided to call the ambulance, by that time he was feeling back to himself in normal.  Ambulance asked if he would like to go to the hospital or not, he decided to go ahead and go to the hospital.  [facility] had an initial troponin of  0.03, subsequently increased to 0.61.  Urine tox screen was negative d-dimer was profoundly elevated at 6491.  Subsequently the patient was sent for stat CT chest which showed a large saddle pulmonary embolism.  He was started on IV heparin infusion. Currently the patient tells me that he is feeling back to his normal self, he sitting up in bed, surrounded by family, in good spirits.  He notes no dyspnea whatsoever, in fact he states that the was not sitting in a hospital bed he would feel like he was his usual self.  He is currently on no supplemental oxygen, his oxygen saturation is 95%.  Incidentally his wife notes that he does snore very loudly, he does occasionally have sleepiness during the day.  Epworth score is 10   Images personally reviewed, CT chest showed large saddle embolism extending to both main pulm arteries.  Right ventricle is enlarged, larger than the left ventricle.   PMHX:   History reviewed. No pertinent past medical history. Surgical Hx:  History reviewed. No pertinent surgical history. Family Hx:  Family History  Problem Relation Age  of Onset  . CAD Maternal Grandmother    Social Hx:   Social History   Tobacco Use  . Smoking status: Current Every Day Smoker  . Smokeless tobacco: Never Used  Substance Use Topics  . Alcohol use: Yes    Comment: occasional   . Drug use: No   Medication:    Current Facility-Administered Medications:  .  0.9 %  sodium chloride infusion, , Intravenous, Continuous, Auburn Bilberry, MD, Last Rate: 75 mL/hr at 08/19/17 0214 .  acetaminophen (TYLENOL) tablet 650 mg, 650 mg, Oral, Q6H PRN  **OR** acetaminophen (TYLENOL) suppository 650 mg, 650 mg, Rectal, Q6H PRN, Auburn Bilberry, MD .  heparin ADULT infusion 100 units/mL (25000 units/244mL sodium chloride 0.45%), 1,400 Units/hr, Intravenous, Continuous, Foye Deer, RPH, Last Rate: 14 mL/hr at 08/19/17 0415, 1,400 Units/hr at 08/19/17 0415 .  nicotine (NICODERM CQ - dosed in mg/24 hours) patch 21 mg, 21 mg, Transdermal, Daily, Auburn Bilberry, MD, 21 mg at 08/19/17 0952 .  ondansetron (ZOFRAN) tablet 4 mg, 4 mg, Oral, Q6H PRN **OR** ondansetron (ZOFRAN) injection 4 mg, 4 mg, Intravenous, Q6H PRN, Auburn Bilberry, MD   Allergies:  Patient has no known allergies.  Review of Systems: Gen:  Denies  fever, sweats, chills HEENT: Denies blurred vision, double vision. bleeds, sore throat Cvc:  No dizziness, chest pain. Resp:   Denies cough or sputum production, shortness of breath Gi: Denies swallowing difficulty, stomach pain. Gu:  Denies bladder incontinence, burning urine Ext:   No Joint pain, stiffness. Skin: No skin rash,  hives  Endoc:  No polyuria, polydipsia. Psych: No depression, insomnia. Other:  All other systems were reviewed with the patient and were negative other that what is mentioned in the HPI.   Physical Examination:   VS: BP 113/69   Pulse 70   Temp 97.7 F (36.5 C) (Oral)   Resp 16   Ht 5' 9.5" (1.765 m)   Wt 230 lb 6.4 oz (104.5 kg)   SpO2 95%   BMI 33.54 kg/m   General Appearance: No distress  Neuro:without focal findings,  speech normal,  HEENT: PERRLA, EOM intact.   Pulmonary: normal breath sounds, No wheezing.  CardiovascularNormal S1,S2.  No m/r/g.   Abdomen: Benign, Soft, non-tender. Renal:  No costovertebral tenderness  GU:  No performed at this time. Endoc: No evident thyromegaly, no signs of acromegaly. Skin:   warm, no rashes, no ecchymosis  Extremities: normal, no cyanosis, clubbing.  Other findings:    LABORATORY PANEL:   CBC Recent Labs  Lab 08/19/17 0327  WBC 8.2    HGB 13.3  HCT 39.9*  PLT 161   ------------------------------------------------------------------------------------------------------------------  Chemistries  Recent Labs  Lab 08/18/17 0524 08/19/17 0327  NA 137 139  K 3.0* 4.6  CL 104 110  CO2 23 23  GLUCOSE 173* 125*  BUN 11 9  CREATININE 1.07 1.10  CALCIUM 9.1 8.7*  MG 2.0  --   AST 31  --   ALT 22  --   ALKPHOS 54  --   BILITOT 1.1  --    ------------------------------------------------------------------------------------------------------------------  Cardiac Enzymes Recent Labs  Lab 08/19/17 1001  TROPONINI 0.31*   ------------------------------------------------------------  RADIOLOGY:  Ct Head Wo Contrast  Result Date: 08/18/2017 CLINICAL DATA:  37 year old male with lightheadedness and weakness. EXAM: CT HEAD WITHOUT CONTRAST TECHNIQUE: Contiguous axial images were obtained from the base of the skull through the vertex without intravenous contrast. COMPARISON:  None. FINDINGS: Brain: No evidence of acute infarction, hemorrhage,  hydrocephalus, extra-axial collection or mass lesion/mass effect. Vascular: No hyperdense vessel or unexpected calcification. Skull: Normal. Negative for fracture or focal lesion. Sinuses/Orbits: No acute finding. Other: None. IMPRESSION: Normal noncontrast CT of the brain. Electronically Signed   By: Elgie Collard M.D.   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 Shortness of breath EXAM: CT ANGIOGRAPHY CHEST WITH CONTRAST TECHNIQUE: Multidetector CT imaging of the chest was performed using the standard protocol during bolus administration of intravenous contrast. Multiplanar CT image reconstructions and MIPs were obtained to evaluate the vascular anatomy. CONTRAST:  75mL ISOVUE-370 IOPAMIDOL (ISOVUE-370) INJECTION 76% COMPARISON:  None FINDINGS:  Cardiovascular: The heart size is normal. There is no pericardial effusion. Mild aortic atherosclerosis. Large saddle embolus and bilateral central obstructing pulmonary emboli identified. Bilateral upper and lower lobar and segmental pulmonary artery filling defects are noted. The RV to LV ratio is equal to 5/3.2 = 1.6. Mediastinum/Nodes: The trachea appears patent and midline. Normal appearance of the thyroid gland. Normal appearance of the esophagus. No enlarged mediastinal or hilar lymph nodes. Lungs/Pleura: No airspace consolidation or atelectasis. No pneumothorax. Upper Abdomen: No acute abnormality. Musculoskeletal: No aggressive lytic or sclerotic bone lesions. Review of the MIP images confirms the above findings. IMPRESSION: 1. Positive for acute PE with CT evidence of right heart strain (RV/LV Ratio = 1.6.) consistent with at least submassive (intermediate risk) PE. The presence of right heart strain has been associated with an increased risk of morbidity and mortality. Please activate Code PE by paging [phone]. 2. Critical Value/emergent results were called by telephone at the time of interpretation on 08/18/2017 at 11:01 am to Dr. Governor Rooks , who verbally acknowledged these results. 3.  Aortic Atherosclerosis (ICD10-I70.0). Electronically Signed   By: Signa Kell M.D.   On: 08/18/2017 11:02   US Venous Img Lower Bilateral  Result Date: 08/18/2017 CLINICAL DATA:  Lower extremity swelling, bilateral pulmonary emboli EXAM: BILATERAL LOWER EXTREMITY VENOUS DOPPLER ULTRASOUND TECHNIQUE: Gray-scale sonography with graded compression, as well as color Doppler and duplex ultrasound were performed to evaluate the lower extremity deep venous systems from the level of the common femoral vein and including the common femoral, femoral, profunda femoral, popliteal and calf veins including the posterior tibial, peroneal and gastrocnemius veins when visible. The superficial great saphenous vein was also  interrogated. Spectral Doppler was utilized to evaluate flow at rest and with distal augmentation maneuvers in the common femoral, femoral and popliteal veins. COMPARISON:  None. FINDINGS: RIGHT LOWER EXTREMITY Common Femoral Vein: No evidence of thrombus. Normal compressibility, respiratory phasicity and response to augmentation. Saphenofemoral Junction: No evidence of thrombus. Normal compressibility and flow on color Doppler imaging. Profunda Femoral Vein: No evidence of thrombus. Normal compressibility and flow on color Doppler imaging. Femoral Vein: Hypoechoic nonocclusive thrombus in the femoral vein from the mid aspect extending distally to the popliteal vein. Vessel is partially compressible. Popliteal Vein: Hypoechoic intraluminal thrombus present. Vessel is minimally compressible. Thrombus is nearly occlusive. Calf Veins: Acute appearing hypoechoic thrombus extends into the right calf tibial and peroneal veins. Superficial Great Saphenous Vein: No evidence of thrombus. Normal compressibility. Venous Reflux:  None. Other Findings:  None. LEFT LOWER EXTREMITY Common Femoral Vein: No evidence of thrombus. Normal compressibility, respiratory phasicity and response to augmentation. Saphenofemoral Junction: No evidence of thrombus. Normal compressibility and flow on color Doppler imaging. Profunda Femoral Vein: No evidence of  thrombus. Normal compressibility and flow on color Doppler imaging. Femoral Vein: No evidence of thrombus. Normal compressibility, respiratory phasicity and response to augmentation. Popliteal Vein: No evidence of thrombus. Normal compressibility, respiratory phasicity and response to augmentation. Calf Veins: Limited assessment of the calf veins. Posterior tibial vein appears patent. Peroneal veins are difficult to visualize. Superficial Great Saphenous Vein: No evidence of thrombus. Normal compressibility. Venous Reflux:  None. Other Findings:  None. IMPRESSION: Positive exam for right  femoropopliteal acute appearing nearly occlusive DVT extending into the right calf veins. Electronically Signed   By: Judie Petit.  Shick M.D.   On: 08/18/2017 16:40   Dg Chest Port 1 View  Result Date: 08/18/2017 CLINICAL DATA:  37 year old male with near syncope. EXAM: PORTABLE CHEST 1 VIEW COMPARISON:  None. FINDINGS: The heart size and mediastinal contours are within normal limits. Both lungs are clear. The visualized skeletal structures are unremarkable. IMPRESSION: No active disease. Electronically Signed   By: Elgie Collard M.D.   On: 08/18/2017 06:04       Thank  you for the consultation and for allowing [facility] to assist in the care of your patient. Our recommendations are noted above.  Please contact us if we can be of further service.   Wells Guiles, MD.  Board Certified in Internal Medicine, Pulmonary Medicine, Critical Care Medicine, and Sleep Medicine.  [facility] Office Number: [phone]  Santiago Glad, M.D.  Billy Fischer, M.D  08/19/2017

## 2017-08-19 NOTE — Progress Notes (Signed)
*  PRELIMINARY RESULTS* Echocardiogram 2D Echocardiogram has been performed.  Cristela BlueHege, Criselda Starke 08/19/2017, 2:00 PM

## 2017-08-19 NOTE — Consult Note (Signed)
[facility] Vascular Consult Note  MRN : [id number]  Jay Barnes is a [age] y.o. (02/04/1981) male who presents with chief complaint of  Chief Complaint  Patient presents with  . Near Syncope  . Emesis  .  History of Present Illness: I am asked to see the patient by Dr. Juliene PinaMody for PE.  The patient was admitted with a few day history chest pain and he had near syncope.  He had no previous history of thrombotic disorder to his knowledge.  He is currently on no supplemental oxygen with an oxygen saturation of over 95%.  He does not feel labored at all.  I examined the patient while they were doing an echocardiogram in the room which did not show significant right heart strain.  He did have a CT scan which I independently reviewed which does show a large bilateral pulmonary emboli.  He also has residual right lower extremity.  Been ordered on anticoagulation and has tolerated that well so far.  No signs of bleeding.  Current Facility-Administered Medications  Medication Dose Route Frequency Provider Last Rate Last Dose  . 0.9 %  sodium chloride infusion   Intravenous Continuous Auburn BilberryPatel, Shreyang, MD 75 mL/hr at 08/19/17 1530    . acetaminophen (TYLENOL) tablet 650 mg  650 mg Oral Q6H PRN Auburn BilberryPatel, Shreyang, MD       Or  . acetaminophen (TYLENOL) suppository 650 mg  650 mg Rectal Q6H PRN Auburn BilberryPatel, Shreyang, MD      . heparin ADULT infusion 100 units/mL (25000 units/25250mL sodium chloride 0.45%)  1,400 Units/hr Intravenous Continuous Foye DeerKluttz, Lisa G, RPH 14 mL/hr at 08/19/17 0415 1,400 Units/hr at 08/19/17 0415  . nicotine (NICODERM CQ - dosed in mg/24 hours) patch 21 mg  21 mg Transdermal Daily Auburn BilberryPatel, Shreyang, MD   21 mg at 08/19/17 [id number]  . ondansetron (ZOFRAN) tablet 4 mg  4 mg Oral Q6H PRN Auburn BilberryPatel, Shreyang, MD       Or  . ondansetron (ZOFRAN) injection 4 mg  4 mg Intravenous Q6H PRN Auburn BilberryPatel, Shreyang, MD        History reviewed. No pertinent past medical history.  History  reviewed. No pertinent surgical history.  Social History Social History   Tobacco Use  . Smoking status: Current Every Day Smoker  . Smokeless tobacco: Never Used  Substance Use Topics  . Alcohol use: Yes    Comment: occasional   . Drug use: No    Family History Family History  Problem Relation Age of Onset  . CAD Maternal Grandmother   No history of bleeding disorders, clotting disorders, autoimmune diseases, or aneurysms  No Known Allergies   REVIEW OF SYSTEMS (Negative unless checked)  Constitutional: [] Weight loss  [] Fever  [] Chills Cardiac: [] Chest pain   [] Chest pressure   [x] Palpitations   [] Shortness of breath when laying flat   [] Shortness of breath at rest   [] Shortness of breath with exertion. Vascular:  [] Pain in legs with walking   [] Pain in legs at rest   [] Pain in legs when laying flat   [] Claudication   [] Pain in feet when walking  [] Pain in feet at rest  [] Pain in feet when laying flat   [] History of DVT   [] Phlebitis   [] Swelling in legs   [] Varicose veins   [] Non-healing ulcers Pulmonary:   [] Uses home oxygen   [] Productive cough   [] Hemoptysis   [] Wheeze  [] COPD   [] Asthma Neurologic:  [] Dizziness  [] Blackouts   [] Seizures   []   History of stroke   [] History of TIA  [] Aphasia   [] Temporary blindness   [] Dysphagia   [] Weakness or numbness in arms   [] Weakness or numbness in legs Musculoskeletal:  [] Arthritis   [] Joint swelling   [] Joint pain   [] Low back pain Hematologic:  [] Easy bruising  [] Easy bleeding   [] Hypercoagulable state   [] Anemic  [] Hepatitis Gastrointestinal:  [] Blood in stool   [] Vomiting blood  [] Gastroesophageal reflux/heartburn   [] Difficulty swallowing. Genitourinary:  [] Chronic kidney disease   [] Difficult urination  [] Frequent urination  [] Burning with urination   [] Blood in urine Skin:  [] Rashes   [] Ulcers   [] Wounds Psychological:  [] History of anxiety   []  History of major depression.  Physical Examination  Vitals:   08/18/17 1333  08/19/17 0359 08/19/17 0738 08/19/17 1702  BP: 129/79 118/75 113/69 115/64  Pulse: 68 72 70 70  Resp: 18 18 16 14   Temp: 98 F (36.7 C) 98 F (36.7 C) 97.7 F (36.5 C) 98.3 F (36.8 C)  TempSrc: Oral Oral Oral Oral  SpO2: 98% 95% 95% 97%  Weight: 104.5 kg (230 lb 6.4 oz)     Height: 5' 9.5" (1.765 m)      Body mass index is 33.54 kg/m. Gen:  WD/WN, NAD.  Resting quietly in the bed.  Respirations not labored.  Not on supplemental oxygen. Head: [place]/AT, No temporalis wasting. Prominent temp pulse not noted. Ear/Nose/Throat: Hearing grossly intact, nares w/o erythema or drainage, oropharynx w/o Erythema/Exudate Eyes: Sclera non-icteric, conjunctiva clear Neck: Trachea midline.  No JVD.  Pulmonary:  Good air movement, respirations not labored Cardiac: RRR, no JVD Vascular:  Vessel Right Left  Radial Palpable Palpable                                    Musculoskeletal: M/S 5/5 throughout.  Extremities without ischemic changes.  No deformity or atrophy. No edema. Neurologic: Sensation grossly intact in extremities.  Symmetrical.  Speech is fluent. Motor exam as listed above. Psychiatric: Judgment intact, Mood & affect appropriate for pt's clinical situation. Dermatologic: No rashes or ulcers noted.  No cellulitis or open wounds.       CBC Lab Results  Component Value Date   WBC 8.2 08/19/2017   HGB 13.3 08/19/2017   HCT 39.9 (L) 08/19/2017   MCV 88.8 08/19/2017   PLT 161 08/19/2017    BMET    Component Value Date/Time   NA 139 08/19/2017 0327   K 4.6 08/19/2017 0327   CL 110 08/19/2017 0327   CO2 23 08/19/2017 0327   GLUCOSE 125 (H) 08/19/2017 0327   BUN 9 08/19/2017 0327   CREATININE 1.10 08/19/2017 0327   CALCIUM 8.7 (L) 08/19/2017 0327   GFRNONAA >60 08/19/2017 0327   GFRAA >60 08/19/2017 0327   Estimated Creatinine Clearance: 108.3 mL/min (by C-G formula based on SCr of 1.1 mg/dL).  COAG Lab Results  Component Value Date   INR 1.10 08/18/2017     Radiology Ct Head Wo Contrast  Result Date: 08/18/2017 CLINICAL DATA:  37 year old male with lightheadedness and weakness. EXAM: CT HEAD WITHOUT CONTRAST TECHNIQUE: Contiguous axial images were obtained from the base of the skull through the vertex without intravenous contrast. COMPARISON:  None. FINDINGS: Brain: No evidence of acute infarction, hemorrhage, hydrocephalus, extra-axial collection or mass lesion/mass effect. Vascular: No hyperdense vessel or unexpected calcification. Skull: Normal. Negative for fracture or focal lesion. Sinuses/Orbits: No acute finding.  Other: None. IMPRESSION: Normal noncontrast CT of the brain. Electronically Signed   By: Elgie Collard M.D.   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 Shortness of breath EXAM: CT ANGIOGRAPHY CHEST WITH CONTRAST TECHNIQUE: Multidetector CT imaging of the chest was performed using the standard protocol during bolus administration of intravenous contrast. Multiplanar CT image reconstructions and MIPs were obtained to evaluate the vascular anatomy. CONTRAST:  75mL ISOVUE-370 IOPAMIDOL (ISOVUE-370) INJECTION 76% COMPARISON:  None FINDINGS: Cardiovascular: The heart size is normal. There is no pericardial effusion. Mild aortic atherosclerosis. Large saddle embolus and bilateral central obstructing pulmonary emboli identified. Bilateral upper and lower lobar and segmental pulmonary artery filling defects are noted. The RV to LV ratio is equal to 5/3.2 = 1.6. Mediastinum/Nodes: The trachea appears patent and midline. Normal appearance of the thyroid gland. Normal appearance of the esophagus. No enlarged mediastinal or hilar lymph nodes. Lungs/Pleura: No airspace consolidation or atelectasis. No pneumothorax. Upper Abdomen: No acute abnormality. Musculoskeletal: No aggressive lytic or sclerotic bone lesions. Review  of the MIP images confirms the above findings. IMPRESSION: 1. Positive for acute PE with CT evidence of right heart strain (RV/LV Ratio = 1.6.) consistent with at least submassive (intermediate risk) PE. The presence of right heart strain has been associated with an increased risk of morbidity and mortality. Please activate Code PE by paging [phone]. 2. Critical Value/emergent results were called by telephone at the time of interpretation on 08/18/2017 at 11:01 am to Dr. Governor Rooks , who verbally acknowledged these results. 3.  Aortic Atherosclerosis (ICD10-I70.0). Electronically Signed   By: Signa Kell M.D.   On: 08/18/2017 11:02   US Venous Img Lower Bilateral  Result Date: 08/18/2017 CLINICAL DATA:  Lower extremity swelling, bilateral pulmonary emboli EXAM: BILATERAL LOWER EXTREMITY VENOUS DOPPLER ULTRASOUND TECHNIQUE: Gray-scale sonography with graded compression, as well as color Doppler and duplex ultrasound were performed to evaluate the lower extremity deep venous systems from the level of the common femoral vein and including the common femoral, femoral, profunda femoral, popliteal and calf veins including the posterior tibial, peroneal and gastrocnemius veins when visible. The superficial great saphenous vein was also interrogated. Spectral Doppler was utilized to evaluate flow at rest and with distal augmentation maneuvers in the common femoral, femoral and popliteal veins. COMPARISON:  None. FINDINGS: RIGHT LOWER EXTREMITY Common Femoral Vein: No evidence of thrombus. Normal compressibility, respiratory phasicity and response to augmentation. Saphenofemoral Junction: No evidence of thrombus. Normal compressibility and flow on color Doppler imaging. Profunda Femoral Vein: No evidence of thrombus. Normal compressibility and flow on color Doppler imaging. Femoral Vein: Hypoechoic nonocclusive thrombus in the femoral vein from the mid aspect extending distally to the popliteal vein. Vessel is  partially compressible. Popliteal Vein: Hypoechoic intraluminal thrombus present. Vessel is minimally compressible. Thrombus is nearly occlusive. Calf Veins: Acute appearing hypoechoic thrombus extends into the right calf tibial and peroneal veins. Superficial Great Saphenous Vein: No evidence of thrombus. Normal compressibility. Venous Reflux:  None. Other Findings:  None. LEFT LOWER EXTREMITY Common Femoral Vein: No evidence of thrombus. Normal compressibility, respiratory phasicity and response to augmentation. Saphenofemoral Junction: No evidence of thrombus. Normal compressibility and flow on color Doppler imaging. Profunda Femoral Vein: No evidence of thrombus. Normal compressibility and flow on color Doppler imaging. Femoral Vein: No evidence of thrombus. Normal compressibility, respiratory phasicity and response to augmentation. Popliteal Vein: No  evidence of thrombus. Normal compressibility, respiratory phasicity and response to augmentation. Calf Veins: Limited assessment of the calf veins. Posterior tibial vein appears patent. Peroneal veins are difficult to visualize. Superficial Great Saphenous Vein: No evidence of thrombus. Normal compressibility. Venous Reflux:  None. Other Findings:  None. IMPRESSION: Positive exam for right femoropopliteal acute appearing nearly occlusive DVT extending into the right calf veins. Electronically Signed   By: Judie Petit.  Shick M.D.   On: 08/18/2017 16:40   Dg Chest Port 1 View  Result Date: 08/18/2017 CLINICAL DATA:  37 year old male with near syncope. EXAM: PORTABLE CHEST 1 VIEW COMPARISON:  None. FINDINGS: The heart size and mediastinal contours are within normal limits. Both lungs are clear. The visualized skeletal structures are unremarkable. IMPRESSION: No active disease. Electronically Signed   By: Elgie Collard M.D.   On: 08/18/2017 06:04      Assessment/Plan 1. Large bilateral pulmonary emboli.  Patient is doing clinically well and I would not recommend  thrombectomy with thrombolytic therapy.  Would recommend anticoagulation for at least 6 months.  This would also treat his lower extremity DVT.  No role for IVC filter currently.  Would agree with hypercoagulable workup as this was apparently an unprovoked DVT.  I will be happy to see him in the office as an outpatient if desired.  No other recommendations from a vascular standpoint. 2.  Tobacco use.  May increase his clotting profile somewhat, but that alone does not usually cause such extensive DVT or PE.    Festus Barren, MD  08/19/2017 5:55 PM    This note was created with Dragon medical transcription system.  Any error is purely unintentional

## 2017-08-19 NOTE — Care Management (Signed)
Patient admitted with submassive pulmonary embolus with right heart strain and found to have DVT.  Vascular and pulmonary consults. On room air.  Prior to this episode of illness Independent in all adls,  and employed. Currently on heparin drip

## 2017-08-19 NOTE — Progress Notes (Signed)
ANTICOAGULATION CONSULT NOTE - Follow-Up Consult  Pharmacy Consult for heparin drip Indication: pulmonary embolus  No Known Allergies  Patient Measurements: Height: 5' 9.5" (176.5 cm) Weight: 230 lb 6.4 oz (104.5 kg) IBW/kg (Calculated) : 71.85 Heparin Dosing Weight: 95 kg  Vital Signs: Temp: 97.7 F (36.5 C) (03/01 0738) Temp Source: Oral (03/01 0738) BP: 113/69 (03/01 0738) Pulse Rate: 70 (03/01 0738)  Labs: Recent Labs    08/18/17 0524 08/18/17 1016  08/18/17 1155 08/18/17 2027 08/19/17 0327 08/19/17 1001  HGB 13.8  --   --   --   --  13.3  --   HCT 42.8  --   --   --   --  39.9*  --   PLT 161  --   --   --   --  161  --   APTT  --   --   --  99*  --   --   --   LABPROT  --   --   --  14.1  --   --   --   INR  --   --   --  1.10  --   --   --   HEPARINUNFRC  --   --    < > 1.32* 0.64 0.88* 0.69  CREATININE 1.07  --   --   --   --  1.10  --   TROPONINI 0.03* 0.61*  --   --   --   --   --    < > = values in this interval not displayed.    Estimated Creatinine Clearance: 108.3 mL/min (by C-G formula based on SCr of 1.1 mg/dL).   Medical History: History reviewed. No pertinent past medical history.  Assessment: Pharmacy consulted to dose and monitor heparin drip in this 37 year old male diagnosed with PE. Patient was not on anticoagulation prior to admission per med rec. Baseline labs ordered.  3/1 10:00 Heparin level resulted at 0.69 Currently Heparin is infusing at 1400 units/hr  Goal of Therapy:  Heparin level 0.3-0.7 units/ml Monitor platelets by anticoagulation protocol: Yes   Plan:  Will continue with current rate and get a confirmation Heparin level in 6 hours. Daily CBC while on Heparin infusion.  Clovia CuffLisa Jumaane Weatherford, PharmD, BCPS 08/19/2017 10:39 AM

## 2017-08-19 NOTE — Consult Note (Addendum)
Hematology/Oncology Consult note [facility] [phone] [phone]  Patient Care Team: System, Pcp Not In as PCP - General   Name of the patient: Jay Barnes  [id number]  04/21/1981   Date of visit: 08/19/17   REASON FOR COSULTATION:  Saddle PE History of presenting illness- This is a 37 year old male with no previous medical history presented to emergency room with complaint of near syncope and not feeling well.  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  Patient denies any recent immobilization, surgery, injury of her lower extremity.  His routine workload is usually driving from [place] to here or solid to [employer].  In the emergency room patient was found to have elevated troponin, and a CT of the chest PE protocol showed bilateral saddle embolism with right heart strain.  Per note ER physician discussed with intensivist and both his call who stated the patient did not need TPA or any other intervention at that point because he was not hypoxic and his blood pressure has been stable.  Patient denies feeling any shortness of breath, chest pain, abdominal pain, or leg swelling.  An ultrasound of lower extremity showed right lower extremity proximal DVT. Denies any history of blood in stool or black stool.  Review of systems- Review of Systems  Constitutional: Negative for chills, fever, malaise/fatigue and weight loss.  HENT: Negative for nosebleeds.   Eyes: Negative for blurred vision and double vision.  Respiratory: Negative for cough, sputum production and shortness of breath.   Cardiovascular: Negative for chest pain and leg swelling.  Gastrointestinal: Negative for abdominal pain, constipation, diarrhea, nausea and vomiting.  Genitourinary: Negative for dysuria.  Musculoskeletal: Negative for myalgias.  Skin: Negative for rash.  Neurological: Negative for dizziness and sensory change.  Endo/Heme/Allergies:  Does not bruise/bleed easily.  Psychiatric/Behavioral: Negative for depression.    No Known Allergies  Patient Active Problem List   Diagnosis Date Noted  . Pulmonary emboli (HCC) 08/18/2017     History reviewed. No pertinent past medical history.   History reviewed. No pertinent surgical history.  Social History   Socioeconomic History  . Marital status: Married    Spouse name: Not on file  . Number of children: Not on file  . Years of education: Not on file  . Highest education level: Not on file  Social Needs  . Financial resource strain: Not on file  . Food insecurity - worry: Not on file  . Food insecurity - inability: Not on file  . Transportation needs - medical: Not on file  . Transportation needs - non-medical: Not on file  Occupational History  . Not on file  Tobacco Use  . Smoking status: Current Every Day Smoker  . Smokeless tobacco: Never Used  Substance and Sexual Activity  . Alcohol use: Yes    Comment: occasional   . Drug use: No  . Sexual activity: Not on file  Other Topics Concern  . Not on file  Social History Narrative  . Not on file     Family History  Problem Relation Age of Onset  . CAD Maternal Grandmother   family history of blood clots on both father and mother's side.  Paternal aunt had breast cancer at age of 77s and paternal uncle prostate cancer in 13s.    Current Facility-Administered Medications:  .  0.9 %  sodium chloride infusion, , Intravenous, Continuous, Auburn Bilberry, MD, Last Rate: 75 mL/hr at 08/19/17 0214 .  acetaminophen (  TYLENOL) tablet 650 mg, 650 mg, Oral, Q6H PRN **OR** acetaminophen (TYLENOL) suppository 650 mg, 650 mg, Rectal, Q6H PRN, Auburn Bilberry, MD .  heparin ADULT infusion 100 units/mL (25000 units/228mL sodium chloride 0.45%), 1,400 Units/hr, Intravenous, Continuous, Foye Deer, RPH, Last Rate: 14 mL/hr at 08/19/17 0415, 1,400 Units/hr at 08/19/17 0415 .  nicotine (NICODERM CQ - dosed in mg/24  hours) patch 21 mg, 21 mg, Transdermal, Daily, Auburn Bilberry, MD, 21 mg at 08/19/17 0952 .  ondansetron (ZOFRAN) tablet 4 mg, 4 mg, Oral, Q6H PRN **OR** ondansetron (ZOFRAN) injection 4 mg, 4 mg, Intravenous, Q6H PRN, Auburn Bilberry, MD   Physical exam:  Vitals:   08/18/17 1130 08/18/17 1333 08/19/17 0359 08/19/17 0738  BP: (!) 142/90 129/79 118/75 113/69  Pulse: 89 68 72 70  Resp: (!) 26 18 18 16   Temp:  98 F (36.7 C) 98 F (36.7 C) 97.7 F (36.5 C)  TempSrc:  Oral Oral Oral  SpO2: 97% 98% 95% 95%  Weight:  230 lb 6.4 oz (104.5 kg)    Height:  5' 9.5" (1.765 m)     GENERAL:Alert, no distress and comfortable.  EYES: no pallor or icterus OROPHARYNX: no thrush or ulceration; good dentition  NECK: supple, no masses felt LYMPH:  no palpable lymphadenopathy in the cervical, axillary or inguinal regions LUNGS: clear to auscultation and  No wheeze or crackles HEART/CVS: regular rate & rhythm and no murmurs; No lower extremity edema ABDOMEN: abdomen soft, non-tender and normal bowel sounds Musculoskeletal:no cyanosis of digits and no clubbing  PSYCH: alert & oriented x 3  NEURO: no focal motor/sensory deficits SKIN:  no rashes or significant lesions   CMP Latest Ref Rng & Units 08/19/2017  Glucose 65 - 99 mg/dL 161(W)  BUN 6 - 20 mg/dL 9  Creatinine 9.60 - 4.54 mg/dL 0.98  Sodium 119 - 147 mmol/L 139  Potassium 3.5 - 5.1 mmol/L 4.6  Chloride 101 - 111 mmol/L 110  CO2 22 - 32 mmol/L 23  Calcium 8.9 - 10.3 mg/dL 8.2(N)  Total Protein 6.5 - 8.1 g/dL -  Total Bilirubin 0.3 - 1.2 mg/dL -  Alkaline Phos 38 - 562 U/L -  AST 15 - 41 U/L -  ALT 17 - 63 U/L -   CBC Latest Ref Rng & Units 08/19/2017  WBC 3.8 - 10.6 K/uL 8.2  Hemoglobin 13.0 - 18.0 g/dL 13.0  Hematocrit 86.5 - 52.0 % 39.9(L)  Platelets 150 - 440 K/uL 161    Ct Head Wo Contrast  Result Date: 08/18/2017 CLINICAL DATA:  37 year old male with lightheadedness and weakness. EXAM: CT HEAD WITHOUT CONTRAST TECHNIQUE:  Contiguous axial images were obtained from the base of the skull through the vertex without intravenous contrast. COMPARISON:  None. FINDINGS: Brain: No evidence of acute infarction, hemorrhage, hydrocephalus, extra-axial collection or mass lesion/mass effect. Vascular: No hyperdense vessel or unexpected calcification. Skull: Normal. Negative for fracture or focal lesion. Sinuses/Orbits: No acute finding. Other: None. IMPRESSION: Normal noncontrast CT of the brain. Electronically Signed   By: Elgie Collard M.D.   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 Shortness of breath EXAM: CT ANGIOGRAPHY CHEST WITH CONTRAST TECHNIQUE: Multidetector CT imaging of the chest was performed using the standard protocol during bolus administration of intravenous contrast. Multiplanar CT image reconstructions and MIPs were obtained to evaluate the vascular anatomy. CONTRAST:  75mL  ISOVUE-370 IOPAMIDOL (ISOVUE-370) INJECTION 76% COMPARISON:  None FINDINGS: Cardiovascular: The heart size is normal. There is no pericardial effusion. Mild aortic atherosclerosis. Large saddle embolus and bilateral central obstructing pulmonary emboli identified. Bilateral upper and lower lobar and segmental pulmonary artery filling defects are noted. The RV to LV ratio is equal to 5/3.2 = 1.6. Mediastinum/Nodes: The trachea appears patent and midline. Normal appearance of the thyroid gland. Normal appearance of the esophagus. No enlarged mediastinal or hilar lymph nodes. Lungs/Pleura: No airspace consolidation or atelectasis. No pneumothorax. Upper Abdomen: No acute abnormality. Musculoskeletal: No aggressive lytic or sclerotic bone lesions. Review of the MIP images confirms the above findings. IMPRESSION: 1. Positive for acute PE with CT evidence of right heart strain (RV/LV Ratio = 1.6.) consistent with at least  submassive (intermediate risk) PE. The presence of right heart strain has been associated with an increased risk of morbidity and mortality. Please activate Code PE by paging [phone]. 2. Critical Value/emergent results were called by telephone at the time of interpretation on 08/18/2017 at 11:01 am to Dr. Governor RooksEBECCA LORD , who verbally acknowledged these results. 3.  Aortic Atherosclerosis (ICD10-I70.0). Electronically Signed   By: Signa Kellaylor  Stroud M.D.   On: 08/18/2017 11:02   [place] Venous Img Lower Bilateral  Result Date: 08/18/2017 CLINICAL DATA:  Lower extremity swelling, bilateral pulmonary emboli EXAM: BILATERAL LOWER EXTREMITY VENOUS DOPPLER ULTRASOUND TECHNIQUE: Gray-scale sonography with graded compression, as well as color Doppler and duplex ultrasound were performed to evaluate the lower extremity deep venous systems from the level of the common femoral vein and including the common femoral, femoral, profunda femoral, popliteal and calf veins including the posterior tibial, peroneal and gastrocnemius veins when visible. The superficial great saphenous vein was also interrogated. Spectral Doppler was utilized to evaluate flow at rest and with distal augmentation maneuvers in the common femoral, femoral and popliteal veins. COMPARISON:  None. FINDINGS: RIGHT LOWER EXTREMITY Common Femoral Vein: No evidence of thrombus. Normal compressibility, respiratory phasicity and response to augmentation. Saphenofemoral Junction: No evidence of thrombus. Normal compressibility and flow on color Doppler imaging. Profunda Femoral Vein: No evidence of thrombus. Normal compressibility and flow on color Doppler imaging. Femoral Vein: Hypoechoic nonocclusive thrombus in the femoral vein from the mid aspect extending distally to the popliteal vein. Vessel is partially compressible. Popliteal Vein: Hypoechoic intraluminal thrombus present. Vessel is minimally compressible. Thrombus is nearly occlusive. Calf Veins: Acute  appearing hypoechoic thrombus extends into the right calf tibial and peroneal veins. Superficial Great Saphenous Vein: No evidence of thrombus. Normal compressibility. Venous Reflux:  None. Other Findings:  None. LEFT LOWER EXTREMITY Common Femoral Vein: No evidence of thrombus. Normal compressibility, respiratory phasicity and response to augmentation. Saphenofemoral Junction: No evidence of thrombus. Normal compressibility and flow on color Doppler imaging. Profunda Femoral Vein: No evidence of thrombus. Normal compressibility and flow on color Doppler imaging. Femoral Vein: No evidence of thrombus. Normal compressibility, respiratory phasicity and response to augmentation. Popliteal Vein: No evidence of thrombus. Normal compressibility, respiratory phasicity and response to augmentation. Calf Veins: Limited assessment of the calf veins. Posterior tibial vein appears patent. Peroneal veins are difficult to visualize. Superficial Great Saphenous Vein: No evidence of thrombus. Normal compressibility. Venous Reflux:  None. Other Findings:  None. IMPRESSION: Positive exam for right femoropopliteal acute appearing nearly occlusive DVT extending into the right calf veins. Electronically Signed   By: Judie PetitM.  Shick M.D.   On: 08/18/2017 16:40   Dg Chest Port 1 View  Result Date: 08/18/2017 CLINICAL DATA:  37 year old  male with near syncope. EXAM: PORTABLE CHEST 1 VIEW COMPARISON:  None. FINDINGS: The heart size and mediastinal contours are within normal limits. Both lungs are clear. The visualized skeletal structures are unremarkable. IMPRESSION: No active disease. Electronically Signed   By: Elgie Collard M.D.   On: 08/18/2017 06:04    Assessment and plan- Patient is a 37 y.o. 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 Appreciate vascular surgeon input to see if intervention is beneficial.    Patient appears asymptomatic.  Duration of anticoagulation: long term.  Hypercoagulable state work up: Factor 5 leiden mutation, prothrombin mutation, lupus anticoagulant and cardiolipin antibodies were sent results pending.   Choice of anticoagulation: awaiting Lupus anticoagulant and cardiolipin antibodies. If negative, he can be converted to DOACs. Given his extent of PE and DVT, prefer patient to receive heparin gtt for at least 72 hours before converting to DOACs, ie Eliquis If lupus anticoagulant or cardiolipin antibodies are positive,  coumadin with INR monitoring is the preferred choice. And his test needs to be repeated in 12 weeks.   Patient lives in [place] and plan to return there at discharge. Need hematologist follow up there. Per patient's request, I called [phone] [facility] to refer him to have a follow up appointment next with with local hematologist. Below is the appointment information Rush Farmer  [phone] Appointment time: March 7th, 1PM Address: [address], [place] My CMA has called patient and provided above information. Patient is instructed to call on 3/4 to confirm appointment time and address.   Dr.Mody, Thank you for this kind referral and the opportunity to participate in the care of this patient. Dr.Brahmanday will be covering this weekend.   Total face to face encounter time for this patient visit was 70 min. >50% of the time was  spent in counseling and coordination of care.  Rickard Patience, MD, PhD Hematology Oncology [facility] at [facility] Pager- [phone] 08/19/2017

## 2017-08-19 NOTE — Plan of Care (Signed)
  Progressing Clinical Measurements: Cardiovascular complication will be avoided 08/19/2017 2308 - Progressing by Myles GipKimrey, Karter Haire M, RN Safety: Ability to remain free from injury will improve 08/19/2017 2308 - Progressing by Myles GipKimrey, Atticus Lemberger M, RN

## 2017-08-19 NOTE — Progress Notes (Addendum)
ANTICOAGULATION CONSULT NOTE - Follow-Up Consult  Pharmacy Consult for heparin drip Indication: pulmonary embolus  No Known Allergies  Patient Measurements: Height: 5' 9.5" (176.5 cm) Weight: 230 lb 6.4 oz (104.5 kg) IBW/kg (Calculated) : 71.85 Heparin Dosing Weight: 95 kg  Vital Signs: Temp: 98.3 F (36.8 C) (03/01 1702) Temp Source: Oral (03/01 1702) BP: 115/64 (03/01 1702) Pulse Rate: 70 (03/01 1702)  Labs: Recent Labs    08/18/17 0524 08/18/17 1016 08/18/17 1155  08/19/17 0327 08/19/17 1001 08/19/17 1546 08/19/17 1728  HGB 13.8  --   --   --  13.3  --   --   --   HCT 42.8  --   --   --  39.9*  --   --   --   PLT 161  --   --   --  161  --   --   --   APTT  --   --  99*  --   --   --   --   --   LABPROT  --   --  14.1  --   --   --   --   --   INR  --   --  1.10  --   --   --   --   --   HEPARINUNFRC  --   --  1.32*   < > 0.88* 0.69  --  0.41  CREATININE 1.07  --   --   --  1.10  --   --   --   TROPONINI 0.03* 0.61*  --   --   --  0.31* 0.22*  --    < > = values in this interval not displayed.    Estimated Creatinine Clearance: 108.3 mL/min (by C-G formula based on SCr of 1.1 mg/dL).   Medical History: History reviewed. No pertinent past medical history.  Assessment: Pharmacy consulted to dose and monitor heparin drip in this 37 year old male diagnosed with PE. Patient was not on anticoagulation prior to admission per med rec. Baseline labs ordered.  Goal of Therapy:  Heparin level 0.3-0.7 units/ml Monitor platelets by anticoagulation protocol: Yes   Plan:  HL = 0.41. Will continue with current rate of 1400 units/hr and get a confirmation Heparin level in 6 hours. Daily CBC while on Heparin infusion.  Yolanda BonineHannah Lifsey, PharmD Pharmacy Resident 08/19/2017 6:55 PM

## 2017-08-19 NOTE — Progress Notes (Signed)
ANTICOAGULATION CONSULT NOTE - Initial Consult  Pharmacy Consult for heparin drip Indication: pulmonary embolus  No Known Allergies  Patient Measurements: Height: 5' 9.5" (176.5 cm) Weight: 230 lb 6.4 oz (104.5 kg) IBW/kg (Calculated) : 71.85 Heparin Dosing Weight: 95 kg  Vital Signs: Temp: 98 F (36.7 C) (03/01 0359) Temp Source: Oral (03/01 0359) BP: 118/75 (03/01 0359) Pulse Rate: 72 (03/01 0359)  Labs: Recent Labs    08/18/17 0524 08/18/17 1016 08/18/17 1155 08/18/17 2027 08/19/17 0327  HGB 13.8  --   --   --  13.3  HCT 42.8  --   --   --  39.9*  PLT 161  --   --   --  161  APTT  --   --  99*  --   --   LABPROT  --   --  14.1  --   --   INR  --   --  1.10  --   --   HEPARINUNFRC  --   --  1.32* 0.64 0.88*  CREATININE 1.07  --   --   --  1.10  TROPONINI 0.03* 0.61*  --   --   --     Estimated Creatinine Clearance: 108.3 mL/min (by C-G formula based on SCr of 1.1 mg/dL).   Medical History: History reviewed. No pertinent past medical history.  Assessment: Pharmacy consulted to dose and monitor heparin drip in this 37 year old male diagnosed with PE. Patient was not on anticoagulation prior to admission per med rec. Baseline labs ordered.  Goal of Therapy:  Heparin level 0.3-0.7 units/ml Monitor platelets by anticoagulation protocol: Yes   Plan:  Give 5500 units bolus x 1 Start heparin infusion at 1600 units/hr Check anti-Xa level in 6 hours and daily while on heparin Continue to monitor H&H and platelets   2/28@2100  HL 0.64, continue current rate of heparin 1600units/hr and recheck in 6 hours per protocol.  03/01 @ 0330 HL 0.88 supratherapeutic. Will decrease rate to 1400 units/hr and recheck HL @ 1000. Hgb stable.  Thomasene Rippleavid  [place], PharmD, BCPS Clinical Pharmacist 08/19/2017,4:15 AM

## 2017-08-20 LAB — LUPUS ANTICOAGULANT PANEL
DRVVT: 42.1 s (ref 0.0–47.0)
PTT Lupus Anticoagulant: 35.5 s (ref 0.0–51.9)

## 2017-08-20 LAB — BETA-2-GLYCOPROTEIN I ABS, IGG/M/A
Beta-2 Glyco I IgG: <9 GPI IgG units (ref 0–20)
Beta-2-Glycoprotein I IgM: <9 GPI IgM units (ref 0–32)

## 2017-08-20 LAB — CARDIOLIPIN ANTIBODIES, IGG, IGM, IGA
Anticardiolipin IgG: <9 GPL U/mL (ref 0–14)
Anticardiolipin IgM: 10 MPL U/mL (ref 0–12)

## 2017-08-20 LAB — PROTEIN C ACTIVITY: PROTEIN C ACTIVITY: 124 % (ref 73–180)

## 2017-08-20 LAB — PROTEIN S, TOTAL: PROTEIN S AG TOTAL: 106 % (ref 60–150)

## 2017-08-20 LAB — PROTEIN C, TOTAL: Protein C, Total: 108 % (ref 60–150)

## 2017-08-20 LAB — PROTEIN S ACTIVITY: PROTEIN S ACTIVITY: 116 % (ref 63–140)

## 2017-08-20 MED ORDER — APIXABAN 5 MG PO TABS: 5.0000 mg | ORAL_TABLET | Freq: Two times a day (BID) | ORAL | 0 refills | Status: AC

## 2017-08-20 MED ORDER — APIXABAN 5 MG PO TABS
5.0000 mg | ORAL_TABLET | Freq: Two times a day (BID) | ORAL | Status: DC
  Administered 2017-08-20: 5 mg via ORAL
  Filled 2017-08-20: qty 1

## 2017-08-20 MED ORDER — NICOTINE 21 MG/24HR TD PT24: 21.0000 mg | MEDICATED_PATCH | Freq: Every day | TRANSDERMAL | 0 refills | Status: AC

## 2017-08-20 NOTE — Progress Notes (Signed)
Patient given discharge instructions with wife at bedside. Prescriptions given with Eliquis coupon provided by case manager. Both IV's taken out and tele monitor off. Patient and wife verbalized understanding with no further questions. Patient will be going home via family vehicle.

## 2017-08-20 NOTE — Care Management Note (Signed)
Case Management Note  Patient Details  Name: Jay Barnes MRN: [id number] Date of Birth: 01/07/1981  Subjective/Objective:    Eliquis coupon was provided.               Action/Plan:   Expected Discharge Date:  08/20/17               Expected Discharge Plan:     In-House Referral:     Discharge planning Services     Post Acute Care Choice:    Choice offered to:     DME Arranged:    DME Agency:     HH Arranged:    HH Agency:     Status of Service:     If discussed at [employer] Meetings, dates discussed:    Additional Comments:  Gary Gabrielsen A, RN 08/20/2017, 9:30 AM

## 2017-08-20 NOTE — Progress Notes (Signed)
ANTICOAGULATION CONSULT NOTE - Follow-Up Consult  Pharmacy Consult for heparin drip Indication: pulmonary embolus  No Known Allergies  Patient Measurements: Height: 5' 9.5" (176.5 cm) Weight: 230 lb 6.4 oz (104.5 kg) IBW/kg (Calculated) : 71.85 Heparin Dosing Weight: 95 kg  Vital Signs: Temp: 97.7 F (36.5 C) (03/01 1955) Temp Source: Oral (03/01 1955) BP: 126/74 (03/01 1955) Pulse Rate: 75 (03/01 1955)  Labs: Recent Labs    08/18/17 0524  08/18/17 1155  08/19/17 0327 08/19/17 1001 08/19/17 1546 08/19/17 1728 08/19/17 2113 08/19/17 2338  HGB 13.8  --   --   --  13.3  --   --   --   --  13.2  HCT 42.8  --   --   --  39.9*  --   --   --   --  39.9*  PLT 161  --   --   --  161  --   --   --   --  151  APTT  --   --  99*  --   --   --   --   --   --   --   LABPROT  --   --  14.1  --   --   --   --   --   --   --   INR  --   --  1.10  --   --   --   --   --   --   --   HEPARINUNFRC  --   --  1.32*   < > 0.88* 0.69  --  0.41  --  0.47  CREATININE 1.07  --   --   --  1.10  --   --   --   --   --   TROPONINI 0.03*   < >  --   --   --  0.31* 0.22*  --  0.21*  --    < > = values in this interval not displayed.    Estimated Creatinine Clearance: 108.3 mL/min (by C-G formula based on SCr of 1.1 mg/dL).   Medical History: History reviewed. No pertinent past medical history.  Assessment: Pharmacy consulted to dose and monitor heparin drip in this 37 year old male diagnosed with PE. Patient was not on anticoagulation prior to admission per med rec. Baseline labs ordered.  Goal of Therapy:  Heparin level 0.3-0.7 units/ml Monitor platelets by anticoagulation protocol: Yes   Plan:  HL = 0.41. Will continue with current rate of 1400 units/hr and get a confirmation Heparin level in 6 hours. Daily CBC while on Heparin infusion.  0301 @ 2330 HL 0.47 therapeutic. This is the third HL drawn that has been therapeutic. Will continue current rate and will recheck next HL @ 0303 w/ am  labs. CBC stable.  Thomasene Rippleavid Cohan Stipes, PharmD, BCPS Clinical Pharmacist 08/20/2017

## 2017-08-20 NOTE — Plan of Care (Signed)
  Adequate for Discharge Education: Knowledge of General Education information will improve 08/20/2017 1131 - Adequate for Discharge by Erma HeritageAlejo Calderon, Maize Brittingham, RN Health Behavior/Discharge Planning: Ability to manage health-related needs will improve 08/20/2017 1131 - Adequate for Discharge by Erma HeritageAlejo Calderon, Jahlen Bollman, RN Clinical Measurements: Ability to maintain clinical measurements within normal limits will improve 08/20/2017 1131 - Adequate for Discharge by Erma HeritageAlejo Calderon, Nova Schmuhl, RN Will remain free from infection 08/20/2017 1131 - Adequate for Discharge by Erma HeritageAlejo Calderon, Poetry Cerro, RN Diagnostic test results will improve 08/20/2017 1131 - Adequate for Discharge by Erma HeritageAlejo Calderon, Dacey Milberger, RN Respiratory complications will improve 08/20/2017 1131 - Adequate for Discharge by Erma HeritageAlejo Calderon, Loranda Mastel, RN Cardiovascular complication will be avoided 08/20/2017 1131 - Adequate for Discharge by Erma HeritageAlejo Calderon, Ndia Sampath, RN Safety: Ability to remain free from injury will improve 08/20/2017 1131 - Adequate for Discharge by Erma HeritageAlejo Calderon, Dariya Gainer, RN

## 2017-08-20 NOTE — Progress Notes (Signed)
Patient transported out via wheelchair.  

## 2017-08-20 NOTE — Discharge Summary (Signed)
[facility] [place] [facility]   PATIENT NAME: Jay Barnes    MR#:  [id number]  DATE OF BIRTH:  09-03-1980  DATE OF ADMISSION:  08/18/2017 ADMITTING PHYSICIAN: Auburn Bilberry, MD  DATE OF DISCHARGE: 08/20/2017  PRIMARY CARE PHYSICIAN: System, Pcp Not In    ADMISSION DIAGNOSIS:  Hypokalemia [E87.6] Swelling [R60.9] Near syncope [R55] Acute saddle pulmonary embolism with acute cor pulmonale (HCC) [I26.02]  DISCHARGE DIAGNOSIS:  Active Problems:   Pulmonary emboli (HCC)   Near syncope   Acute deep vein thrombosis (DVT) of femoral vein of right lower extremity (HCC)   SECONDARY DIAGNOSIS:  History reviewed. No pertinent past medical history.  HOSPITAL COURSE:   37 year old male with history of tobacco dependence who presents with near syncope.  1. Large saddle emboli with acute right femoropopliteal DVT, unprovoked: Echocardiogram showed  Left ventricle: The cavity size was normal. Systolic function was   normal. The estimated ejection fraction was in the range of 60%   to 65%. Wall motion was normal; there were no regional wall   motion abnormalities. Left ventricular diastolic function   parameters were normal. - Left atrium: The atrium was normal in size. - Right ventricle: Systolic function was normal. - Pulmonary arteries: Systolic pressure was mildly elevated. PA   peak pressure: 36 mm Hg (S).  Patient was started on heparin. He is transitioned to oral anticoagulation. So far his hypercoagulable workup was negative. He was evaluated by oncology while in the hospital. He has outpatient follow-up in [place] with Dr. Salvatore Marvel. This was arranged by our oncologist. His appointment is March 7. Side effects alternatives, risks and benefits of ventricle showed discussed with him and his wife. He accepts seizures.   2.Tobacco dependence: Patient is encouraged to quit smoking. Counseling was provided for 4 minutes.   3. Elevated  troponin: This is due to large saddle pulmonary emboli. He was ruled out for ACS   4. Hypokalemia: Repleted     DISCHARGE CONDITIONS AND DIET:     CONSULTS OBTAINED:  Treatment Team:  Rickard Patience, MD Annice Needy, MD  DRUG ALLERGIES:  No Known Allergies  DISCHARGE MEDICATIONS:   Allergies as of 08/20/2017   No Known Allergies     Medication List    TAKE these medications   apixaban 5 MG Tabs tablet Commonly known as:  ELIQUIS Take 1 tablet (5 mg total) by mouth 2 (two) times daily.   nicotine 21 mg/24hr patch Commonly known as:  NICODERM CQ - dosed in mg/24 hours Place 1 patch (21 mg total) onto the skin daily.         Today   CHIEF COMPLAINT:   Patient without shortness of breath, chest pain,    VITAL SIGNS:  Blood pressure (!) 123/58, pulse 65, temperature (!) 97.3 F (36.3 C), temperature source Oral, resp. rate 18, height 5' 9.5" (1.765 m), weight 104.5 kg (230 lb 6.4 oz), SpO2 97 %.   REVIEW OF SYSTEMS:  Review of Systems  Constitutional: Negative.  Negative for chills, fever and malaise/fatigue.  HENT: Negative.  Negative for ear discharge, ear pain, hearing loss, nosebleeds and sore throat.   Eyes: Negative.  Negative for blurred vision and pain.  Respiratory: Negative.  Negative for cough, hemoptysis, shortness of breath and wheezing.   Cardiovascular: Negative.  Negative for chest pain, palpitations and leg swelling.  Gastrointestinal: Negative.  Negative for abdominal pain, blood in stool, diarrhea, nausea and vomiting.  Genitourinary: Negative.  Negative for dysuria.  Musculoskeletal: Negative.  Negative for back pain.  Skin: Negative.   Neurological: Negative for dizziness, tremors, speech change, focal weakness, seizures and headaches.  Endo/Heme/Allergies: Negative.  Does not bruise/bleed easily.  Psychiatric/Behavioral: Negative.  Negative for depression, hallucinations and suicidal ideas.     PHYSICAL EXAMINATION:  GENERAL:  37  y.o.-year-old patient lying in the bed with no acute distress.  NECK:  Supple, no jugular venous distention. No thyroid enlargement, no tenderness.  LUNGS: Normal breath sounds bilaterally, no wheezing, rales,rhonchi  No use of accessory muscles of respiration.  CARDIOVASCULAR: S1, S2 normal. No murmurs, rubs, or gallops.  ABDOMEN: Soft, non-tender, non-distended. Bowel sounds present. No organomegaly or mass.  EXTREMITIES: No pedal edema, cyanosis, or clubbing.  PSYCHIATRIC: The patient is alert and oriented x 3.  SKIN: No obvious rash, lesion, or ulcer.   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 Shortness of breath EXAM: CT ANGIOGRAPHY CHEST WITH CONTRAST TECHNIQUE: Multidetector CT imaging of the chest was performed using the standard protocol during bolus administration of intravenous contrast. Multiplanar CT image reconstructions and MIPs were obtained to evaluate the vascular anatomy. CONTRAST:  75mL ISOVUE-370 IOPAMIDOL (ISOVUE-370) INJECTION 76% COMPARISON:  None FINDINGS: Cardiovascular: The heart size is normal. There is no pericardial effusion. Mild aortic atherosclerosis. Large saddle embolus and bilateral central obstructing pulmonary emboli identified. Bilateral upper and lower lobar and segmental pulmonary artery filling defects are noted. The RV to  LV ratio is equal to 5/3.2 = 1.6. Mediastinum/Nodes: The trachea appears patent and midline. Normal appearance of the thyroid gland. Normal appearance of the esophagus. No enlarged mediastinal or hilar lymph nodes. Lungs/Pleura: No airspace consolidation or atelectasis. No pneumothorax. Upper Abdomen: No acute abnormality. Musculoskeletal: No aggressive lytic or sclerotic bone lesions. Review of the MIP images confirms the above findings. IMPRESSION: 1. Positive for acute PE with CT evidence of right heart strain (RV/LV Ratio = 1.6.) consistent with at least submassive (intermediate risk) PE. The presence of right heart strain has been associated with an increased risk of morbidity and mortality. Please activate Code PE by paging [phone]. 2. Critical Value/emergent results were called by telephone at the time of interpretation on 08/18/2017 at 11:01 am to Dr. Governor Rooks , who verbally acknowledged these results. 3.  Aortic Atherosclerosis (ICD10-I70.0). Electronically Signed   By: Signa Kell M.D.   On: 08/18/2017 11:02   US Venous Img Lower Bilateral  Result Date: 08/18/2017 CLINICAL DATA:  Lower extremity swelling, bilateral pulmonary emboli EXAM: BILATERAL LOWER EXTREMITY VENOUS DOPPLER ULTRASOUND TECHNIQUE: Gray-scale sonography with graded compression, as well as color Doppler and duplex ultrasound were performed to evaluate the lower extremity deep venous  systems from the level of the common femoral vein and including the common femoral, femoral, profunda femoral, popliteal and calf veins including the posterior tibial, peroneal and gastrocnemius veins when visible. The superficial great saphenous vein was also interrogated. Spectral Doppler was utilized to evaluate flow at rest and with distal augmentation maneuvers in the common femoral, femoral and popliteal veins. COMPARISON:  None. FINDINGS: RIGHT LOWER EXTREMITY Common Femoral Vein: No evidence of thrombus. Normal compressibility,  respiratory phasicity and response to augmentation. Saphenofemoral Junction: No evidence of thrombus. Normal compressibility and flow on color Doppler imaging. Profunda Femoral Vein: No evidence of thrombus. Normal compressibility and flow on color Doppler imaging. Femoral Vein: Hypoechoic nonocclusive thrombus in the femoral vein from the mid aspect extending distally to the popliteal vein. Vessel is partially compressible. Popliteal Vein: Hypoechoic intraluminal thrombus present. Vessel is minimally compressible. Thrombus is nearly occlusive. Calf Veins: Acute appearing hypoechoic thrombus extends into the right calf tibial and peroneal veins. Superficial Great Saphenous Vein: No evidence of thrombus. Normal compressibility. Venous Reflux:  None. Other Findings:  None. LEFT LOWER EXTREMITY Common Femoral Vein: No evidence of thrombus. Normal compressibility, respiratory phasicity and response to augmentation. Saphenofemoral Junction: No evidence of thrombus. Normal compressibility and flow on color Doppler imaging. Profunda Femoral Vein: No evidence of thrombus. Normal compressibility and flow on color Doppler imaging. Femoral Vein: No evidence of thrombus. Normal compressibility, respiratory phasicity and response to augmentation. Popliteal Vein: No evidence of thrombus. Normal compressibility, respiratory phasicity and response to augmentation. Calf Veins: Limited assessment of the calf veins. Posterior tibial vein appears patent. Peroneal veins are difficult to visualize. Superficial Great Saphenous Vein: No evidence of thrombus. Normal compressibility. Venous Reflux:  None. Other Findings:  None. IMPRESSION: Positive exam for right femoropopliteal acute appearing nearly occlusive DVT extending into the right calf veins. Electronically Signed   By: Judie PetitM.  Shick M.D.   On: 08/18/2017 16:40      Allergies as of 08/20/2017   No Known Allergies     Medication List    TAKE these medications   apixaban 5 MG  Tabs tablet Commonly known as:  ELIQUIS Take 1 tablet (5 mg total) by mouth 2 (two) times daily.   nicotine 21 mg/24hr patch Commonly known as:  NICODERM CQ - dosed in mg/24 hours Place 1 patch (21 mg total) onto the skin daily.          Management plans discussed with the patient and he is in agreement. Stable for discharge home  Patient should follow up with oncology  CODE STATUS:     Code Status Orders  (From admission, onward)        Start     Ordered   08/18/17 1324  Full code  Continuous     08/18/17 1323    Code Status History    Date Active Date Inactive Code Status Order ID Comments User Context   This patient has a current code status but no historical code status.      TOTAL TIME TAKING CARE OF THIS PATIENT: 38 minutes.    Note: This dictation was prepared with Dragon dictation along with smaller phrase technology. Any transcriptional errors that result from this process are unintentional.  Trinaty Bundrick M.D on 08/20/2017 at 9:05 AM  Between 7am to 6pm - Pager - [phone] After 6pm go to [email or web address] - password [email or web address]  [facility]  Office  [phone]  CC: Primary care physician; System, Pcp Not In

## 2017-08-20 NOTE — Progress Notes (Signed)
Pt refusing bed alarm, but was educated about safety. Will continue to monitor.

## 2017-08-22 LAB — PROTHROMBIN GENE MUTATION

## 2017-08-23 LAB — FACTOR 5 LEIDEN

## 2018-12-13 IMAGING — CT CT HEAD W/O CM
3 series · 16 of 47 positions shown, 19 images · non-contrast
Comparison: None.

CLINICAL DATA: 37-year-old male with lightheadedness and weakness.

EXAM:
CT HEAD WITHOUT CONTRAST
TECHNIQUE: Contiguous axial images were obtained from the base of the skull
through the vertex without intravenous contrast.

[Series 3: head wo · axial · 0.43mm/px · z∈[-92,+33]mm · 10 of 30 slices shown, 13 images]
[im 3/30  brain]
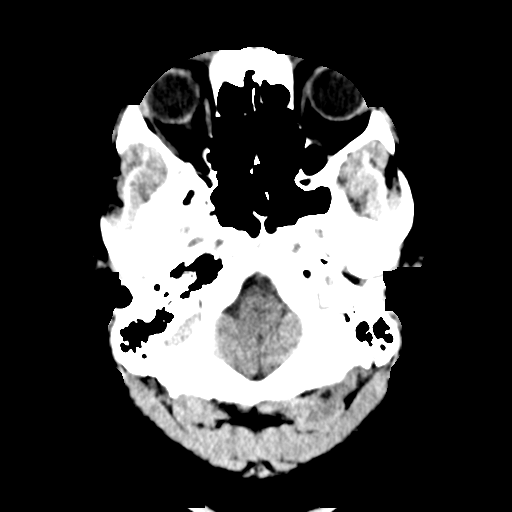
[im 3/30  bone]
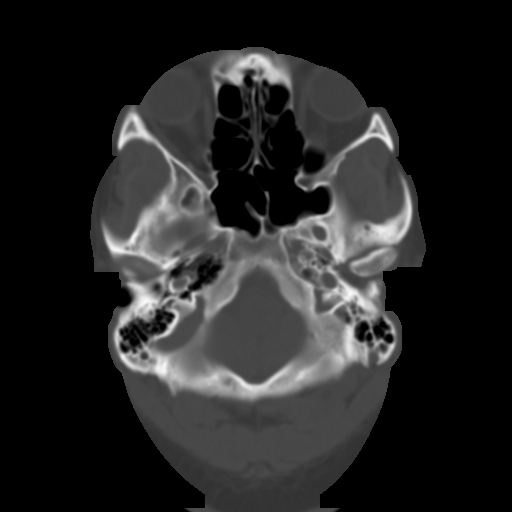
[im 6/30  brain]
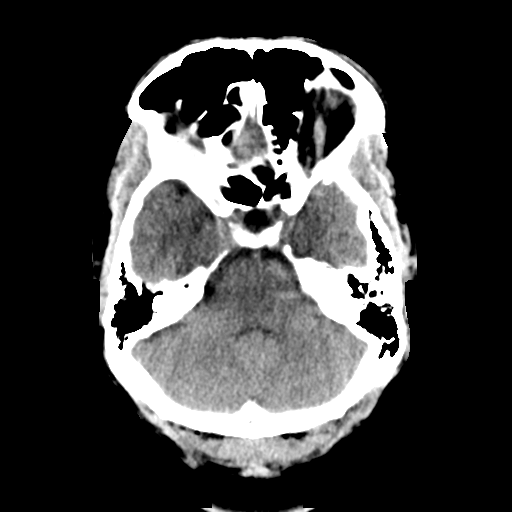
[im 9/30  brain]
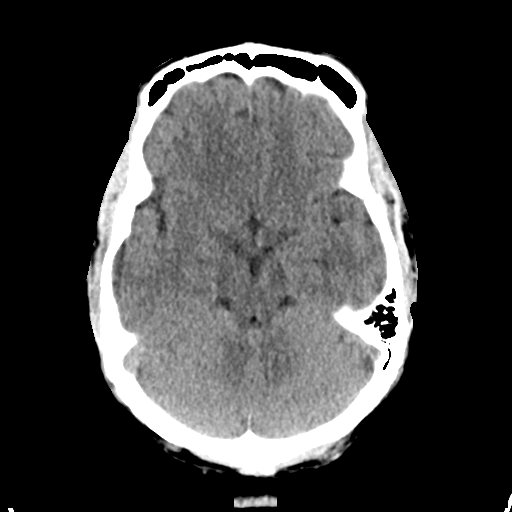
[im 11/30  brain]
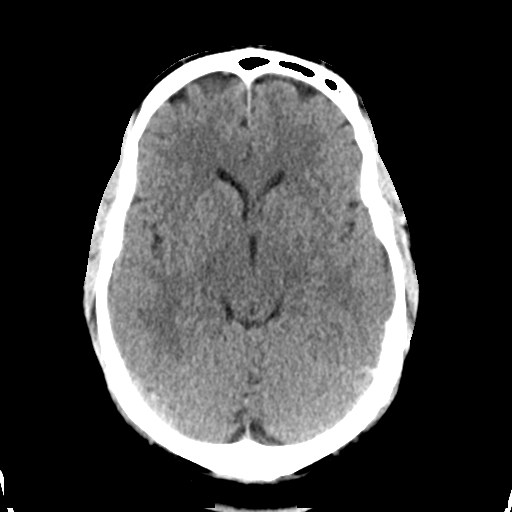
[im 14/30  brain]
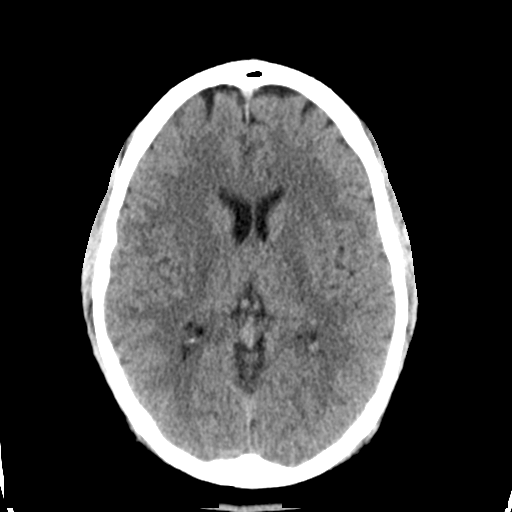
[im 14/30  bone]
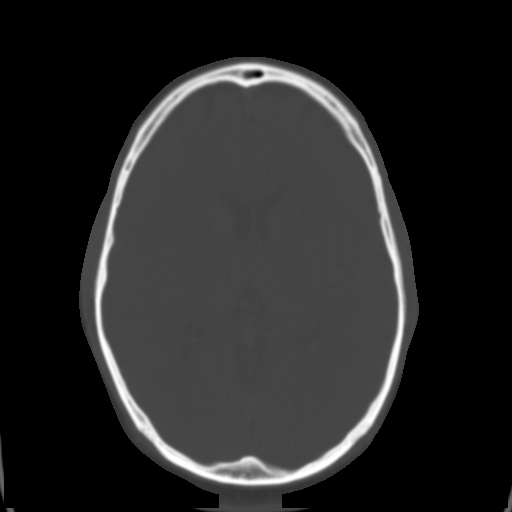
[im 17/30  brain]
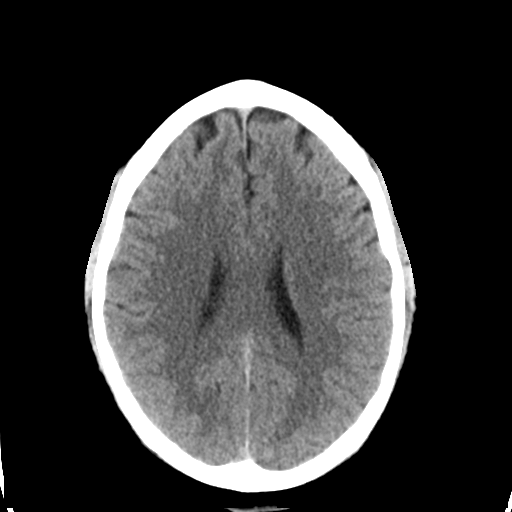
[im 20/30  brain]
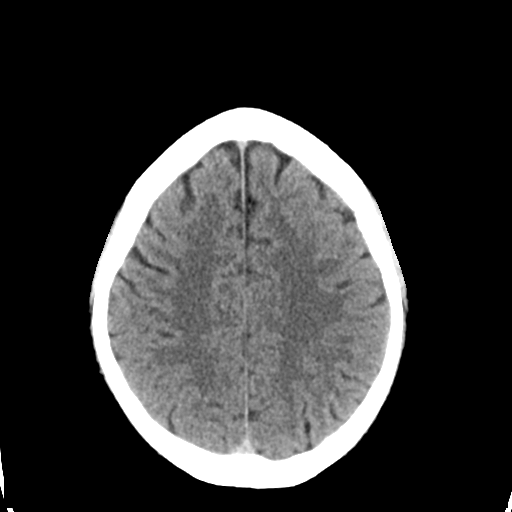
[im 23/30  brain]
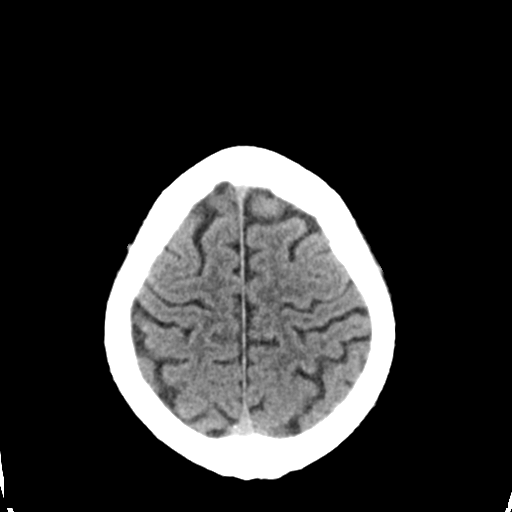
[im 25/30  brain]
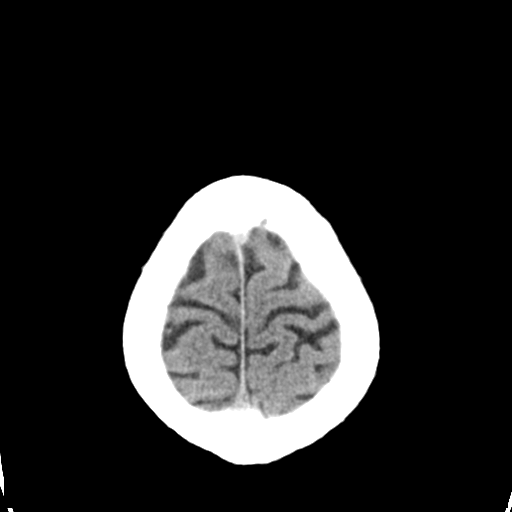
[im 25/30  bone]
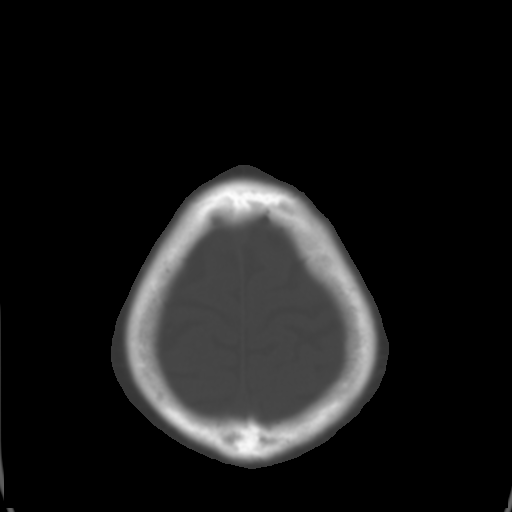
[im 28/30  brain]
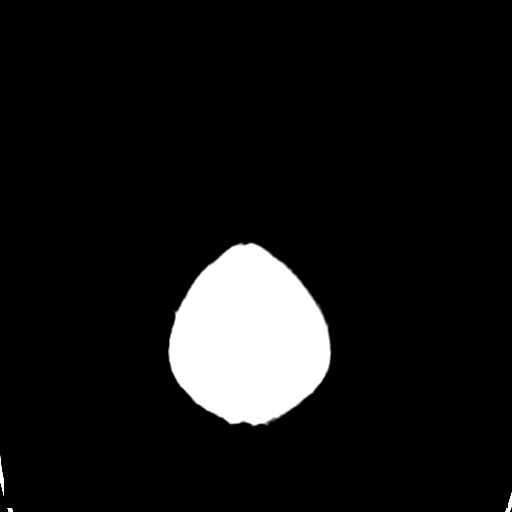

[Series 4: coronal soft tissue · coronal · 0.32mm/px · 3 of 66 slices shown]
[im 22/66  brain]
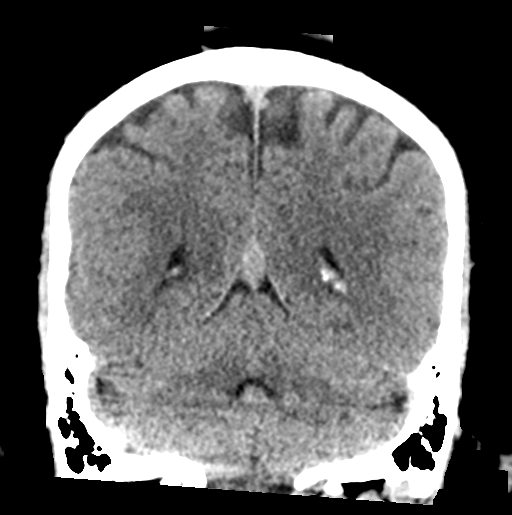
[im 29/66  brain]
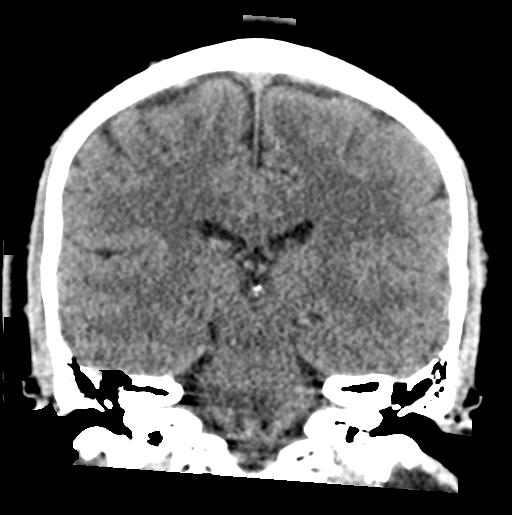
[im 37/66  brain]
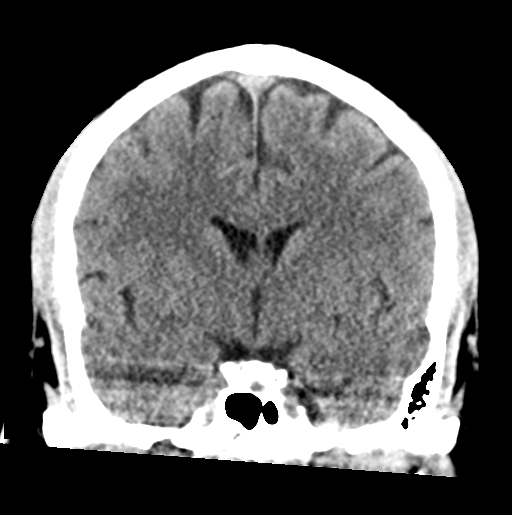

[Series 5: sagittal soft tissue · sagittal · 0.32mm/px · 3 of 49 slices shown]
[im 17/49  brain]
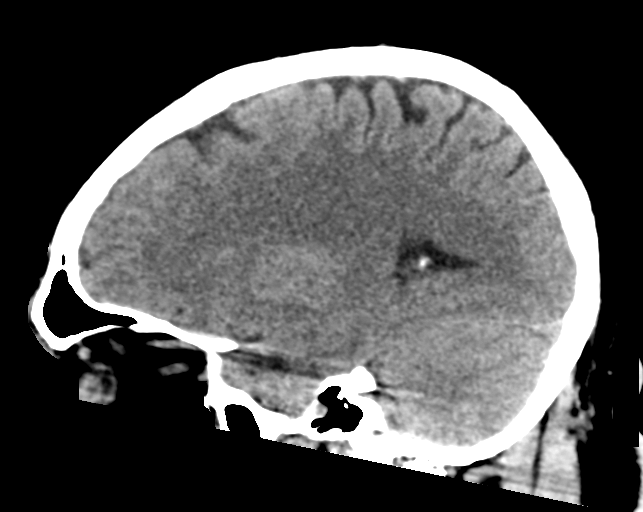
[im 25/49  brain]
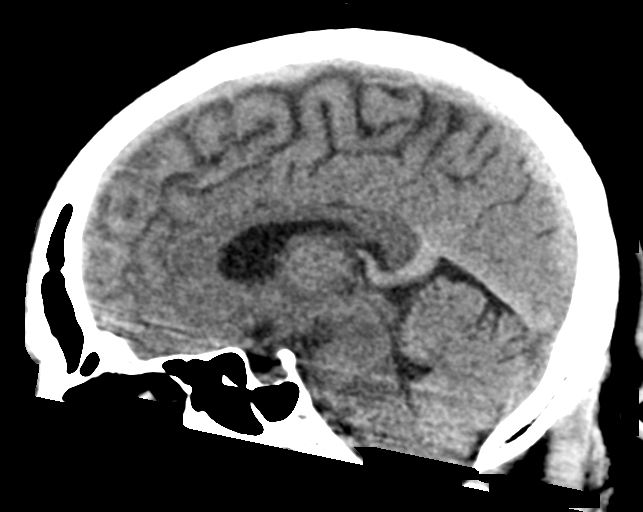
[im 33/49  brain]
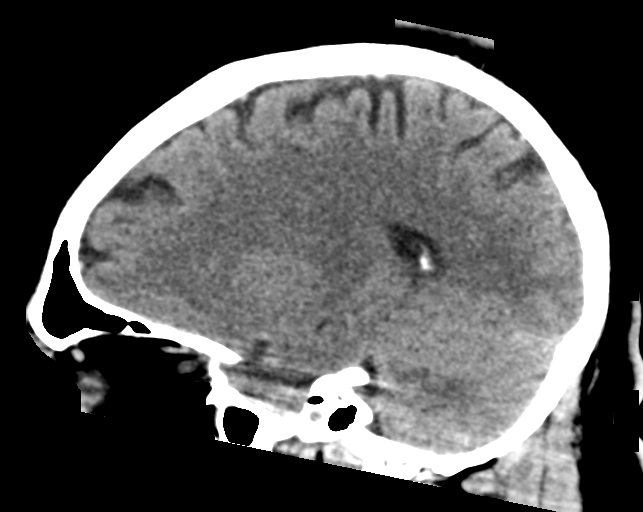

[16 of 47 positions shown; findings below may reference images not displayed]

FINDINGS: Brain: No evidence of acute infarction, hemorrhage, hydrocephalus,
extra-axial collection or mass lesion/mass effect.

Vascular: No hyperdense vessel or unexpected calcification.

Skull: Normal. Negative for fracture or focal lesion.

Sinuses/Orbits: No acute finding.

Other: None.
IMPRESSION: Normal noncontrast CT of the brain.
